# Patient Record
Sex: Female | Born: 1950 | Race: White | Hispanic: No | Smoking: Former smoker
Health system: Southern US, Community
[De-identification: ages and names within clinical notes are randomized; demographics above are authoritative.]

## PROBLEM LIST (undated history)

## (undated) DIAGNOSIS — Z972 Presence of dental prosthetic device (complete) (partial): Secondary | ICD-10-CM

## (undated) DIAGNOSIS — Z720 Tobacco use: Secondary | ICD-10-CM

## (undated) DIAGNOSIS — E7800 Pure hypercholesterolemia, unspecified: Secondary | ICD-10-CM

## (undated) DIAGNOSIS — I639 Cerebral infarction, unspecified: Secondary | ICD-10-CM

## (undated) HISTORY — PX: CHOLECYSTECTOMY: SHX55

---

## 2004-05-15 ENCOUNTER — Other Ambulatory Visit: Payer: Self-pay

## 2005-05-01 ENCOUNTER — Ambulatory Visit: Payer: Self-pay | Admitting: Family Medicine

## 2006-05-24 ENCOUNTER — Emergency Department: Payer: Self-pay | Admitting: Emergency Medicine

## 2010-02-06 ENCOUNTER — Inpatient Hospital Stay: Payer: Self-pay | Admitting: Surgery

## 2011-07-27 ENCOUNTER — Emergency Department: Payer: Self-pay | Admitting: *Deleted

## 2011-10-26 ENCOUNTER — Emergency Department: Payer: Self-pay | Admitting: Emergency Medicine

## 2017-01-22 ENCOUNTER — Encounter: Payer: Self-pay | Admitting: *Deleted

## 2017-01-22 ENCOUNTER — Ambulatory Visit
Admission: EM | Admit: 2017-01-22 | Discharge: 2017-01-22 | Disposition: A | Discharge status: Home / Self Care | Payer: Medicare Other | Attending: Family Medicine | Admitting: Family Medicine

## 2017-01-22 DIAGNOSIS — B86 Scabies: Secondary | ICD-10-CM | POA: Diagnosis not present

## 2017-01-22 MED ORDER — PERMETHRIN 5 % EX CREA: TOPICAL_CREAM | CUTANEOUS | 0 refills | Status: DC

## 2017-01-22 NOTE — ED Provider Notes (Signed)
[facility]    CSN: [id number] Arrival date & time: 01/22/17  1709     History   Chief Complaint Chief Complaint  Patient presents with  . Rash    HPI Shelly Mitchell is a 66 y.o. female.   The history is provided by the patient.  Rash  Location:  Shoulder/arm Shoulder/arm rash location:  L arm, R arm, L hand and R hand Quality: itchiness, redness and scaling   Severity:  Moderate Onset quality:  Sudden Duration:  3 days Timing:  Constant Progression:  Worsening Chronicity:  New Context: not animal contact, not chemical exposure, not diapers, not eggs, not exposure to similar rash, not food, not hot tub use, not insect bite/sting, not medications, not new detergent/soap, not nuts, not plant contact, not pollen, not pregnancy, not sick contacts and not sun exposure   Context comment:  After helping friend move out and clean; states contact with many fabrics, clothing, etc  Relieved by:  Nothing Associated symptoms: no abdominal pain, no diarrhea, no fatigue, no fever, no headaches, no hoarse voice, no induration, no joint pain, no myalgias, no nausea, no periorbital edema, no shortness of breath, no sore throat, no throat swelling, no tongue swelling, no URI, not vomiting and not wheezing   Associated symptoms comment:  Severe itching; worse at night   History reviewed. No pertinent past medical history.  There are no active problems to display for this patient.   Past Surgical History:  Procedure Laterality Date  . CHOLECYSTECTOMY      OB History    No data available       Home Medications    Prior to Admission medications   Medication Sig Start Date End Date Taking? Authorizing Provider  permethrin (ELIMITE) 5 % cream Apply to affected area once 01/22/17   Payton Mccallum, MD    Family History History reviewed. No pertinent family history.  Social History Social History  Substance Use Topics  . Smoking status: Current Every Day Smoker    . Smokeless tobacco: Never Used  . Alcohol use No     Allergies   Patient has no known allergies.   Review of Systems Review of Systems  Constitutional: Negative for fatigue and fever.  HENT: Negative for hoarse voice and sore throat.   Respiratory: Negative for shortness of breath and wheezing.   Gastrointestinal: Negative for abdominal pain, diarrhea, nausea and vomiting.  Musculoskeletal: Negative for arthralgias and myalgias.  Skin: Positive for rash.  Neurological: Negative for headaches.     Physical Exam Triage Vital Signs ED Triage Vitals  Enc Vitals Group     BP 01/22/17 1837 (!) 195/91     Pulse Rate 01/22/17 1837 69     Resp 01/22/17 1837 16     Temp 01/22/17 1837 99.1 F (37.3 C)     Temp Source 01/22/17 1837 Oral     SpO2 01/22/17 1837 99 %     Weight 01/22/17 1841 176 lb (79.8 kg)     Height 01/22/17 1841 5\' 2"  (1.575 m)     Head Circumference --      Peak Flow --      Pain Score 01/22/17 1858 0     Pain Loc --      Pain Edu? --      Excl. in GC? --    No data found.   Updated Vital Signs BP (!) 195/91 (BP Location: Left Arm)   Pulse 69   Temp 99.1  F (37.3 C) (Oral)   Resp 16   Ht 5\' 2"  (1.575 m)   Wt 176 lb (79.8 kg)   SpO2 99%   BMI 32.19 kg/m   Visual Acuity Right Eye Distance:   Left Eye Distance:   Bilateral Distance:    Right Eye Near:   Left Eye Near:    Bilateral Near:     Physical Exam  Constitutional: She appears well-developed and well-nourished. No distress.  Skin: Rash noted. She is not diaphoretic. There is erythema (with burrows and excoriations on hands and arms).  Nursing note and vitals reviewed.    UC Treatments / Results  Labs (all labs ordered are listed, but only abnormal results are displayed) Labs Reviewed - No data to display  EKG  EKG Interpretation None       Radiology No results found.  Procedures Procedures (including critical care time)  Medications Ordered in UC Medications - No  data to display   Initial Impression / Assessment and Plan / UC Course  I have reviewed the triage vital signs and the nursing notes.  Pertinent labs & imaging results that were available during my care of the patient were reviewed by me and considered in my medical decision making (see chart for details).       Final Clinical Impressions(s) / UC Diagnoses   Final diagnoses:  Scabies    New Prescriptions Discharge Medication List as of 01/22/2017  6:57 PM    START taking these medications   Details  permethrin (ELIMITE) 5 % cream Apply to affected area once, Normal       1. diagnosis reviewed with patient 2. rx as per orders above; reviewed possible side effects, interactions, risks and benefits  3. Recommend supportive treatment with otc antihistamines  4. Follow-up prn if symptoms worsen or don't improve   Payton Mccallumonty, Adonai Selsor, MD 01/22/17 1940

## 2017-01-22 NOTE — ED Triage Notes (Signed)
Rash to arms since Wednesday.

## 2017-08-26 DIAGNOSIS — I639 Cerebral infarction, unspecified: Secondary | ICD-10-CM

## 2017-08-26 HISTORY — DX: Cerebral infarction, unspecified: I63.9

## 2018-07-27 ENCOUNTER — Inpatient Hospital Stay
Admission: EM | Admit: 2018-07-27 | Discharge: 2018-07-28 | DRG: 065 | Disposition: A | Discharge status: Home / Self Care | Payer: Medicare HMO | Attending: Internal Medicine | Admitting: Internal Medicine

## 2018-07-27 ENCOUNTER — Other Ambulatory Visit: Payer: Self-pay

## 2018-07-27 ENCOUNTER — Observation Stay: Payer: Medicare HMO

## 2018-07-27 ENCOUNTER — Encounter: Payer: Self-pay | Admitting: Emergency Medicine

## 2018-07-27 ENCOUNTER — Emergency Department: Payer: Medicare HMO

## 2018-07-27 ENCOUNTER — Ambulatory Visit (INDEPENDENT_AMBULATORY_CARE_PROVIDER_SITE_OTHER)
Admission: EM | Admit: 2018-07-27 | Discharge: 2018-07-27 | Disposition: A | Discharge status: Other Acute Inpatient Hospital | Payer: Medicare HMO | Source: Home / Self Care | Attending: Family Medicine | Admitting: Family Medicine

## 2018-07-27 DIAGNOSIS — F17200 Nicotine dependence, unspecified, uncomplicated: Secondary | ICD-10-CM

## 2018-07-27 DIAGNOSIS — R531 Weakness: Secondary | ICD-10-CM

## 2018-07-27 DIAGNOSIS — R202 Paresthesia of skin: Secondary | ICD-10-CM | POA: Diagnosis not present

## 2018-07-27 DIAGNOSIS — E785 Hyperlipidemia, unspecified: Secondary | ICD-10-CM | POA: Diagnosis present

## 2018-07-27 DIAGNOSIS — Z720 Tobacco use: Secondary | ICD-10-CM | POA: Diagnosis not present

## 2018-07-27 DIAGNOSIS — R200 Anesthesia of skin: Secondary | ICD-10-CM | POA: Insufficient documentation

## 2018-07-27 DIAGNOSIS — G459 Transient cerebral ischemic attack, unspecified: Secondary | ICD-10-CM

## 2018-07-27 DIAGNOSIS — Z716 Tobacco abuse counseling: Secondary | ICD-10-CM

## 2018-07-27 DIAGNOSIS — G8191 Hemiplegia, unspecified affecting right dominant side: Secondary | ICD-10-CM | POA: Diagnosis not present

## 2018-07-27 DIAGNOSIS — R030 Elevated blood-pressure reading, without diagnosis of hypertension: Secondary | ICD-10-CM

## 2018-07-27 DIAGNOSIS — I6381 Other cerebral infarction due to occlusion or stenosis of small artery: Principal | ICD-10-CM | POA: Diagnosis present

## 2018-07-27 DIAGNOSIS — I63533 Cerebral infarction due to unspecified occlusion or stenosis of bilateral posterior cerebral arteries: Secondary | ICD-10-CM | POA: Diagnosis not present

## 2018-07-27 DIAGNOSIS — R42 Dizziness and giddiness: Secondary | ICD-10-CM | POA: Diagnosis not present

## 2018-07-27 DIAGNOSIS — Z823 Family history of stroke: Secondary | ICD-10-CM

## 2018-07-27 DIAGNOSIS — I639 Cerebral infarction, unspecified: Secondary | ICD-10-CM | POA: Diagnosis not present

## 2018-07-27 DIAGNOSIS — Z9049 Acquired absence of other specified parts of digestive tract: Secondary | ICD-10-CM | POA: Diagnosis not present

## 2018-07-27 DIAGNOSIS — I6523 Occlusion and stenosis of bilateral carotid arteries: Secondary | ICD-10-CM | POA: Diagnosis not present

## 2018-07-27 DIAGNOSIS — R29703 NIHSS score 3: Secondary | ICD-10-CM | POA: Diagnosis present

## 2018-07-27 DIAGNOSIS — R51 Headache: Secondary | ICD-10-CM | POA: Diagnosis not present

## 2018-07-27 DIAGNOSIS — G4489 Other headache syndrome: Secondary | ICD-10-CM | POA: Diagnosis not present

## 2018-07-27 DIAGNOSIS — I10 Essential (primary) hypertension: Secondary | ICD-10-CM | POA: Diagnosis not present

## 2018-07-27 DIAGNOSIS — F17210 Nicotine dependence, cigarettes, uncomplicated: Secondary | ICD-10-CM | POA: Diagnosis present

## 2018-07-27 HISTORY — DX: Tobacco use: Z72.0

## 2018-07-27 LAB — CBC
HCT: 40.3 % (ref 36.0–46.0)
HEMOGLOBIN: 12.8 g/dL (ref 12.0–15.0)
MCH: 30.5 pg (ref 26.0–34.0)
MCHC: 31.8 g/dL (ref 30.0–36.0)
MCV: 96 fL (ref 80.0–100.0)
Platelets: 303 10*3/uL (ref 150–400)
RBC: 4.2 MIL/uL (ref 3.87–5.11)
RDW: 12.9 % (ref 11.5–15.5)
WBC: 10 10*3/uL (ref 4.0–10.5)
nRBC: 0 % (ref 0.0–0.2)

## 2018-07-27 LAB — GLUCOSE, CAPILLARY
Glucose-Capillary: 111 mg/dL — ABNORMAL HIGH (ref 70–99)
Glucose-Capillary: 111 mg/dL — ABNORMAL HIGH (ref 70–99)

## 2018-07-27 LAB — URINE DRUG SCREEN, QUALITATIVE (ARMC ONLY)
Amphetamines, Ur Screen: NOT DETECTED
Barbiturates, Ur Screen: NOT DETECTED
Benzodiazepine, Ur Scrn: NOT DETECTED
Cannabinoid 50 Ng, Ur ~~LOC~~: NOT DETECTED
Cocaine Metabolite,Ur ~~LOC~~: NOT DETECTED
MDMA (Ecstasy)Ur Screen: NOT DETECTED
Methadone Scn, Ur: NOT DETECTED
OPIATE, UR SCREEN: NOT DETECTED
Phencyclidine (PCP) Ur S: NOT DETECTED
Tricyclic, Ur Screen: NOT DETECTED

## 2018-07-27 LAB — PROTIME-INR
INR: 1.05
Prothrombin Time: 13.6 seconds (ref 11.4–15.2)

## 2018-07-27 LAB — COMPREHENSIVE METABOLIC PANEL
ALT: 16 U/L (ref 0–44)
AST: 20 U/L (ref 15–41)
Albumin: 3.7 g/dL (ref 3.5–5.0)
Alkaline Phosphatase: 93 U/L (ref 38–126)
Anion gap: 6 (ref 5–15)
BUN: 15 mg/dL (ref 8–23)
CO2: 29 mmol/L (ref 22–32)
Calcium: 8.8 mg/dL — ABNORMAL LOW (ref 8.9–10.3)
Chloride: 106 mmol/L (ref 98–111)
Creatinine, Ser: 0.85 mg/dL (ref 0.44–1.00)
GFR calc Af Amer: >60 mL/min (ref >60)
GFR calc non Af Amer: >60 mL/min (ref >60)
GLUCOSE: 105 mg/dL — AB (ref 70–99)
Potassium: 3.7 mmol/L (ref 3.5–5.1)
Sodium: 141 mmol/L (ref 135–145)
Total Bilirubin: 0.5 mg/dL (ref 0.3–1.2)
Total Protein: 7 g/dL (ref 6.5–8.1)

## 2018-07-27 LAB — APTT: aPTT: 27 seconds (ref 24–36)

## 2018-07-27 LAB — URINALYSIS, COMPLETE (UACMP) WITH MICROSCOPIC
BILIRUBIN URINE: NEGATIVE
Glucose, UA: NEGATIVE mg/dL
KETONES UR: NEGATIVE mg/dL
Nitrite: NEGATIVE
PROTEIN: NEGATIVE mg/dL
Specific Gravity, Urine: 1.006 (ref 1.005–1.030)
pH: 6 (ref 5.0–8.0)

## 2018-07-27 LAB — DIFFERENTIAL
ABS IMMATURE GRANULOCYTES: 0.04 10*3/uL (ref 0.00–0.07)
Basophils Absolute: 0.1 10*3/uL (ref 0.0–0.1)
Basophils Relative: 1 %
Eosinophils Absolute: 0.2 10*3/uL (ref 0.0–0.5)
Eosinophils Relative: 2 %
Immature Granulocytes: 0 %
Lymphocytes Relative: 24 %
Lymphs Abs: 2.4 10*3/uL (ref 0.7–4.0)
Monocytes Absolute: 0.7 10*3/uL (ref 0.1–1.0)
Monocytes Relative: 7 %
Neutro Abs: 6.6 10*3/uL (ref 1.7–7.7)
Neutrophils Relative %: 66 %

## 2018-07-27 LAB — TROPONIN I: Troponin I: <0.03 ng/mL (ref <0.03)

## 2018-07-27 LAB — ETHANOL: Alcohol, Ethyl (B): <10 mg/dL (ref <10)

## 2018-07-27 MED ORDER — ASPIRIN 81 MG PO CHEW
324.0000 mg | CHEWABLE_TABLET | Freq: Once | ORAL | Status: AC
  Administered 2018-07-27: 324 mg via ORAL
  Filled 2018-07-27: qty 4

## 2018-07-27 MED ORDER — ACETAMINOPHEN 650 MG RE SUPP: 650.0000 mg | RECTAL | Status: DC | PRN

## 2018-07-27 MED ORDER — ATORVASTATIN CALCIUM 20 MG PO TABS
40.0000 mg | ORAL_TABLET | Freq: Every day | ORAL | Status: DC
  Administered 2018-07-27 – 2018-07-28 (×2): 40 mg via ORAL
  Filled 2018-07-27 (×2): qty 2

## 2018-07-27 MED ORDER — NICOTINE 21 MG/24HR TD PT24
21.0000 mg | MEDICATED_PATCH | Freq: Every day | TRANSDERMAL | Status: DC
  Filled 2018-07-27: qty 1

## 2018-07-27 MED ORDER — ASPIRIN 325 MG PO TABS
325.0000 mg | ORAL_TABLET | Freq: Every day | ORAL | Status: DC
  Administered 2018-07-28: 09:00:00 325 mg via ORAL
  Filled 2018-07-27 (×2): qty 1

## 2018-07-27 MED ORDER — STROKE: EARLY STAGES OF RECOVERY BOOK
Freq: Once | Status: AC
  Administered 2018-07-27: 18:00:00

## 2018-07-27 MED ORDER — ACETAMINOPHEN 160 MG/5ML PO SOLN
650.0000 mg | ORAL | Status: DC | PRN
  Filled 2018-07-27: qty 20.3

## 2018-07-27 MED ORDER — ACETAMINOPHEN 325 MG PO TABS
650.0000 mg | ORAL_TABLET | ORAL | Status: DC | PRN
  Administered 2018-07-27 (×2): 650 mg via ORAL
  Filled 2018-07-27 (×2): qty 2

## 2018-07-27 MED ORDER — ENOXAPARIN SODIUM 40 MG/0.4ML ~~LOC~~ SOLN
40.0000 mg | SUBCUTANEOUS | Status: DC
  Administered 2018-07-27 – 2018-07-28 (×2): 40 mg via SUBCUTANEOUS
  Filled 2018-07-27 (×2): qty 0.4

## 2018-07-27 NOTE — Progress Notes (Signed)
   07/27/18 1341  Clinical Encounter Type  Visited With Patient not available;Other (Comment) (unit chaplain)  Visit Type Code   Chaplain responded to code stroke, waited outside patient's room while nurse engaged patient and visitor.  Unit chaplain arrived and on-call chaplain transitioned away to another patient.

## 2018-07-27 NOTE — H&P (Signed)
[facility] [place] [facility]   PATIENT NAME: Shelly Mitchell    MR#:  [id number]  DATE OF BIRTH:  17-Jul-1951  DATE OF ADMISSION:  07/27/2018  PRIMARY CARE PHYSICIAN: Patient, No Pcp Per   REQUESTING/REFERRING PHYSICIAN: Sharman Cheek, MD  CHIEF COMPLAINT:   Chief Complaint  Patient presents with  . Weakness    HISTORY OF PRESENT ILLNESS: Shelly Mitchell  is a 67 y.o. female with no medical problems except nicotine abuse who is presenting to the hospital with complaint of headache and right arm weakness and right leg weakness.  Patient states that the symptoms started this morning.  They have since improved and mostly resolved.  Swallowing difficulties no numbness or tingling.      PAST MEDICAL HISTORY:  History reviewed. No pertinent past medical history.  PAST SURGICAL HISTORY:  Past Surgical History:  Procedure Laterality Date  . CHOLECYSTECTOMY      SOCIAL HISTORY:  Social History   Tobacco Use  . Smoking status: Current Every Day Smoker  . Smokeless tobacco: Never Used  Substance Use Topics  . Alcohol use: No    FAMILY HISTORY:  Family History  Problem Relation Age of Onset  . CVA Mother     DRUG ALLERGIES: No Known Allergies  REVIEW OF SYSTEMS:   CONSTITUTIONAL: No fever, fatigue or weakness.  EYES: No blurred or double vision.  EARS, NOSE, AND THROAT: No tinnitus or ear pain.  RESPIRATORY: No cough, shortness of breath, wheezing or hemoptysis.  CARDIOVASCULAR: No chest pain, orthopnea, edema.  GASTROINTESTINAL: No nausea, vomiting, diarrhea or abdominal pain.  GENITOURINARY: No dysuria, hematuria.  ENDOCRINE: No polyuria, nocturia,  HEMATOLOGY: No anemia, easy bruising or bleeding SKIN: No rash or lesion. MUSCULOSKELETAL: No joint pain or arthritis.   NEUROLOGIC: Right arm weakness right lower extremity weakness PSYCHIATRY: No anxiety or depression.   MEDICATIONS AT HOME:  Prior to Admission medications    Medication Sig Start Date End Date Taking? Authorizing Provider  permethrin (ELIMITE) 5 % cream Apply to affected area once 01/22/17   Payton Mccallum, MD      PHYSICAL EXAMINATION:   VITAL SIGNS: Blood pressure (!) 190/62, pulse 67, temperature 98.1 F (36.7 C), temperature source Oral, resp. rate 20, weight 79.8 kg, SpO2 97 %.  GENERAL:  67 y.o.-year-old patient lying in the bed with no acute distress.  EYES: Pupils equal, round, reactive to light and accommodation. No scleral icterus. Extraocular muscles intact.  HEENT: Head atraumatic, normocephalic. Oropharynx and nasopharynx clear.  NECK:  Supple, no jugular venous distention. No thyroid enlargement, no tenderness.  LUNGS: Normal breath sounds bilaterally, no wheezing, rales,rhonchi or crepitation. No use of accessory muscles of respiration.  CARDIOVASCULAR: S1, S2 normal. No murmurs, rubs, or gallops.  ABDOMEN: Soft, nontender, nondistended. Bowel sounds present. No organomegaly or mass.  EXTREMITIES: No pedal edema, cyanosis, or clubbing.  NEUROLOGIC: Cranial nerves II through XII are intact. Muscle strength 4/5 i right upper extremity right lower extremity sensation intact. Gait not checked.  PSYCHIATRIC: The patient is alert and oriented x 3.  SKIN: No obvious rash, lesion, or ulcer.   LABORATORY PANEL:   CBC Recent Labs  Lab 07/27/18 1337  WBC 10.0  HGB 12.8  HCT 40.3  PLT 303  MCV 96.0  MCH 30.5  MCHC 31.8  RDW 12.9  LYMPHSABS 2.4  MONOABS 0.7  EOSABS 0.2  BASOSABS 0.1   ------------------------------------------------------------------------------------------------------------------  Chemistries  Recent Labs  Lab 07/27/18 1337  NA 141  K  3.7  CL 106  CO2 29  GLUCOSE 105*  BUN 15  CREATININE 0.85  CALCIUM 8.8*  AST 20  ALT 16  ALKPHOS 93  BILITOT 0.5   ------------------------------------------------------------------------------------------------------------------ estimated creatinine  clearance is 62.9 mL/min (by C-G formula based on SCr of 0.85 mg/dL). ------------------------------------------------------------------------------------------------------------------ No results for input(s): TSH, T4TOTAL, T3FREE, THYROIDAB in the last 72 hours.  Invalid input(s): FREET3   Coagulation profile Recent Labs  Lab 07/27/18 1337  INR 1.05   ------------------------------------------------------------------------------------------------------------------- No results for input(s): DDIMER in the last 72 hours. -------------------------------------------------------------------------------------------------------------------  Cardiac Enzymes Recent Labs  Lab 07/27/18 1337  TROPONINI <0.03   ------------------------------------------------------------------------------------------------------------------ Invalid input(s): POCBNP  ---------------------------------------------------------------------------------------------------------------  Urinalysis    Component Value Date/Time   COLORURINE YELLOW (A) 07/27/2018 1455   APPEARANCEUR CLOUDY (A) 07/27/2018 1455   LABSPEC 1.006 07/27/2018 1455   PHURINE 6.0 07/27/2018 1455   GLUCOSEU NEGATIVE 07/27/2018 1455   HGBUR SMALL (A) 07/27/2018 1455   BILIRUBINUR NEGATIVE 07/27/2018 1455   KETONESUR NEGATIVE 07/27/2018 1455   PROTEINUR NEGATIVE 07/27/2018 1455   NITRITE NEGATIVE 07/27/2018 1455   LEUKOCYTESUR TRACE (A) 07/27/2018 1455     RADIOLOGY: US Carotid Bilateral (at [facility] Only)  Result Date: 07/27/2018 CLINICAL DATA:  Stroke/TIA.  History hypertension and smoking. EXAM: BILATERAL CAROTID DUPLEX ULTRASOUND TECHNIQUE: Wallace Cullens scale imaging, color Doppler and duplex ultrasound were performed of bilateral carotid and vertebral arteries in the neck. COMPARISON:  None. FINDINGS: Criteria: Quantification of carotid stenosis is based on velocity parameters that correlate the residual internal carotid diameter with  NASCET-based stenosis levels, using the diameter of the distal internal carotid lumen as the denominator for stenosis measurement. The following velocity measurements were obtained: RIGHT ICA:  108/35 cm/sec CCA:  90/15 cm/sec SYSTOLIC ICA/CCA RATIO:  1.1 ECA:  98 cm/sec LEFT ICA:  82/24 cm/sec CCA:  80/20 cm/sec SYSTOLIC ICA/CCA RATIO:  1.0 ECA:  87 cm/sec RIGHT CAROTID ARTERY: There is a minimal amount of hypoechoic plaque within the right carotid bulb (image 14 and 16), not resulting in elevated peak systolic velocities within the interrogated course of the right internal carotid artery to suggest a hemodynamically significant stenosis. RIGHT VERTEBRAL ARTERY:  Antegrade Flow LEFT CAROTID ARTERY: There is a minimal amount of mixed echogenic plaque within the left carotid bulb (image 49), not resulting in elevated peak systolic velocities within the interrogated course of the left internal carotid artery to suggest a hemodynamically significant stenosis. LEFT VERTEBRAL ARTERY:  Antegrade Flow IMPRESSION: Minimal amount of bilateral atherosclerotic plaque left subjectively greater than right, not resulting in a hemodynamically significant stenosis within either internal carotid artery. Electronically Signed   By: Simonne Come M.D.   On: 07/27/2018 16:36   Ct Head Code Stroke Wo Contrast  Result Date: 07/27/2018 CLINICAL DATA:  Code stroke. Frontal headache with slight right upper and lower extremity weakness and paresthesia. EXAM: CT HEAD WITHOUT CONTRAST TECHNIQUE: Contiguous axial images were obtained from the base of the skull through the vertex without intravenous contrast. COMPARISON:  None. FINDINGS: Brain: There is no evidence of acute infarct, intracranial hemorrhage, mass, midline shift, or extra-axial fluid collection. Focal encephalomalacia is noted in the body of the corpus callosum on the left. Periventricular white matter hypodensities elsewhere are nonspecific but compatible with mild chronic  small vessel ischemic disease. Cerebral volume is normal. Vascular: Calcified atherosclerosis at the skull base. No hyperdense vessel. Skull: No fracture or suspicious osseous lesion. Sinuses/Orbits: Visualized paranasal sinuses and mastoid air cells are clear. Visualized orbits are  unremarkable. Other: None. ASPECTS (Alberta Stroke Program Early CT Score) - Ganglionic level infarction ([facility], lentiform nuclei, internal capsule, insula, M1-M3 cortex): 7 - Supraganglionic infarction (M4-M6 cortex): 3 Total score (0-10 with 10 being normal): 10 IMPRESSION: 1. No evidence of acute intracranial abnormality. 2. ASPECTS is 10. 3. Mild chronic small vessel ischemic disease. 4. 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 These results were called by telephone at the time of interpretation on 07/27/2018 at 1:50 pm to Dr. Sharman CheekPHILLIP STAFFORD , who verbally acknowledged these results. Electronically Signed   By: Sebastian AcheAllen  Grady M.D.   On: 07/27/2018 13:51    EKG: Orders placed or performed during the hospital encounter of 07/27/18  . ED EKG  . ED EKG    IMPRESSION AND PLAN: Patient 67 year old presenting with right upper extremity right lower extremity weakness  1.  Acute CVA We will obtain a swallow eval PT evaluation Obtain MRI MRI of the brain Carotid Doppler Echocardiogram of the heart Aspirin Start patient on Lipitor Lipid panel in the morning Neurology evaluation  2.  Nicotine abuse smoking cessation provided strongly recommend patient stop smoking 4 minutes spent nicotine patch will be started    All the records are reviewed and case discussed with ED provider. Management plans discussed with the patient, family and they are in agreement.  CODE STATUS:    TOTAL TIME TAKING CARE OF THIS PATIENT: 55 minutes.    Auburn BilberryShreyang Maleka Contino M.D on 07/27/2018 at 4:52 PM  Between 7am to 6pm - Pager - [phone]  After 6pm go to [email or web address] - password [email or web address] [facility]  [facility]  [phone]  CC: Primary care physician; Patient, No Pcp Per

## 2018-07-27 NOTE — Consult Note (Signed)
CODE STROKE- PHARMACY COMMUNICATION   Time CODE STROKE called/page received:[phone]  Time response to CODE STROKE was made (in person or via phone):   Time Stroke Kit retrieved from [facility] (only if needed):Patient declined tPA  Name of Provider/Nurse contacted: Joelene Millin  No past medical history on file. Prior to Admission medications   Medication Sig Start Date End Date Taking? Authorizing Provider  permethrin Nancy Fetter) 5 % cream Apply to affected area once 01/22/17   Norval Gable, MD     Paticia Stack, PharmD Pharmacy Resident  07/27/2018 2:20 PM

## 2018-07-27 NOTE — ED Notes (Signed)
Patient concludes neuro consult and declines TPA. Speech clear, patient coherent, daughter at bedside, time of onset now prior to 11am due to daughter noticing got call from mom at 11am and mom states symptoms started prior to that.

## 2018-07-27 NOTE — ED Provider Notes (Signed)
[facility]    CSN: [id number] Arrival date & time: 07/27/18  1157     History   Chief Complaint Chief Complaint  Patient presents with  . Dizziness  . Numbness    HPI Shelly Mitchell is a 67 y.o. female.   67 yo female, smoker, presents with a c/o sudden onset of right arm and right leg numbness and weakness about 1-1.5 hours ago, associated with sudden onset of headache and dizziness.  States she had been putting some Christmas lights outside and had just walked into her house to do dishes when symptoms began. Denies any vision changes, slurred speech, chest pains or shortness of breath.   No prior medical history known as patient states she does not see any doctors.   The history is provided by the patient.  Dizziness    History reviewed. No pertinent past medical history.  There are no active problems to display for this patient.   Past Surgical History:  Procedure Laterality Date  . CHOLECYSTECTOMY      OB History   None      Home Medications    Prior to Admission medications   Medication Sig Start Date End Date Taking? Authorizing Provider  permethrin (ELIMITE) 5 % cream Apply to affected area once 01/22/17   Payton Mccallumonty, Aiman Noe, MD    Family History History reviewed. No pertinent family history.  Social History Social History   Tobacco Use  . Smoking status: Current Every Day Smoker  . Smokeless tobacco: Never Used  Substance Use Topics  . Alcohol use: No  . Drug use: No     Allergies   Patient has no known allergies.   Review of Systems Review of Systems  Neurological: Positive for dizziness.     Physical Exam Triage Vital Signs ED Triage Vitals  Enc Vitals Group     BP 07/27/18 1207 (!) 165/70     Pulse Rate 07/27/18 1207 90     Resp 07/27/18 1207 18     Temp 07/27/18 1207 97.8 F (36.6 C)     Temp Source 07/27/18 1207 Oral     SpO2 07/27/18 1207 100 %     Weight 07/27/18 1210 176 lb (79.8 kg)     Height  07/27/18 1210 5\' 2"  (1.575 m)     Head Circumference --      Peak Flow --      Pain Score 07/27/18 1209 0     Pain Loc --      Pain Edu? --      Excl. in GC? --    No data found.  Updated Vital Signs BP (!) 165/70 (BP Location: Right Arm)   Pulse 90   Temp 97.8 F (36.6 C) (Oral)   Resp 18   Ht 5\' 2"  (1.575 m)   Wt 79.8 kg   SpO2 100%   BMI 32.19 kg/m   Visual Acuity Right Eye Distance:   Left Eye Distance:   Bilateral Distance:    Right Eye Near:   Left Eye Near:    Bilateral Near:     Physical Exam  Constitutional: She is oriented to person, place, and time. She appears well-developed and well-nourished. No distress.  Cardiovascular: Normal rate, regular rhythm, normal heart sounds and intact distal pulses.  Pulmonary/Chest: Effort normal and breath sounds normal. No stridor. No respiratory distress. She has no wheezes. She has no rales.  Neurological: She is alert and oriented to person, place, and time. She  displays normal reflexes. No cranial nerve deficit or sensory deficit. She exhibits abnormal muscle tone (right upper and lower extremity weakness noted). Coordination normal.  Skin: She is not diaphoretic.  Nursing note and vitals reviewed.    UC Treatments / Results  Labs (all labs ordered are listed, but only abnormal results are displayed) Labs Reviewed  GLUCOSE, CAPILLARY - Abnormal; Notable for the following components:      Result Value   Glucose-Capillary 111 (*)    All other components within normal limits  CBG MONITORING, ED    EKG None  Radiology No results found.  Procedures ED EKG Date/Time: 07/27/2018 12:39 PM Performed by: Payton Mccallum, MD Authorized by: Payton Mccallum, MD   ECG reviewed by ED Physician in the absence of a cardiologist: yes   Previous ECG:    Previous ECG:  Unavailable Interpretation:    Interpretation: normal   Rate:    ECG rate assessment: normal   Rhythm:    Rhythm: sinus rhythm   Ectopy:    Ectopy:  none   QRS:    QRS axis:  Normal Conduction:    Conduction: normal   ST segments:    ST segments:  Normal T waves:    T waves: normal   Other findings:    Other findings: prolonged qTc interval     (including critical care time)  Medications Ordered in UC Medications - No data to display  Initial Impression / Assessment and Plan / UC Course  I have reviewed the triage vital signs and the nursing notes.  Pertinent labs & imaging results that were available during my care of the patient were reviewed by me and considered in my medical decision making (see chart for details).      Final Clinical Impressions(s) / UC Diagnoses   Final diagnoses:  Right sided numbness  Right sided weakness  Elevated blood pressure reading   Discharge Instructions   None    ED Prescriptions    None     1. diagnosis reviewed with patient and daughter; recommend patient go to ED by EMS for further evaluation and management. IV placed.    Controlled Substance Prescriptions [place] Controlled Substance Registry consulted? Not Applicable   Payton Mccallum, MD 07/27/18 1256

## 2018-07-27 NOTE — ED Provider Notes (Signed)
[facility] Emergency Department Provider Note  ____________________________________________  Time seen: Approximately 2:37 PM  I have reviewed the triage vital signs and the nursing notes.   HISTORY  Chief Complaint Weakness    HPI Shelly Mitchell is a 67 y.o. female with a history of hypertension and smoking who complains of sudden onset of frontal headache at about 11:30 AM today, associated with weakness and paresthesia of the right arm and right leg.  Denies any vision changes, vomiting, syncope.  No chest pain or shortness of breath.  Symptoms have been constant, no aggravating or alleviating factors, appear to be improving since onset.      Past medical history noncontributory   There are no active problems to display for this patient.    Past Surgical History:  Procedure Laterality Date  . CHOLECYSTECTOMY       Prior to Admission medications   Medication Sig Start Date End Date Taking? Authorizing Provider  permethrin (ELIMITE) 5 % cream Apply to affected area once 01/22/17   Payton Mccallum, MD     Allergies Patient has no known allergies.   No family history on file.  Social History Social History   Tobacco Use  . Smoking status: Current Every Day Smoker  . Smokeless tobacco: Never Used  Substance Use Topics  . Alcohol use: No  . Drug use: No    Review of Systems  Constitutional:   No fever or chills.  ENT:   No sore throat. No rhinorrhea. Cardiovascular:   No chest pain or syncope. Respiratory:   No dyspnea or cough. Gastrointestinal:   Negative for abdominal pain, vomiting and diarrhea.  Musculoskeletal:   Negative for focal pain or swelling All other systems reviewed and are negative except as documented above in ROS and HPI.  ____________________________________________   PHYSICAL EXAM:  VITAL SIGNS: ED Triage Vitals  Enc Vitals Group     BP 07/27/18 1331 (!) 213/79     Pulse Rate 07/27/18 1331 90      Resp 07/27/18 1351 18     Temp 07/27/18 1331 97.8 F (36.6 C)     Temp Source 07/27/18 1331 Oral     SpO2 07/27/18 1331 99 %     Weight 07/27/18 1332 175 lb 14.8 oz (79.8 kg)     Height --      Head Circumference --      Peak Flow --      Pain Score 07/27/18 1331 0     Pain Loc --      Pain Edu? --      Excl. in GC? --     Vital signs reviewed, nursing assessments reviewed.   Constitutional:   Alert and oriented. Non-toxic appearance. Eyes:   Conjunctivae are normal. EOMI. PERRL. ENT      Head:   Normocephalic and atraumatic.      Nose:   No congestion/rhinnorhea.       Mouth/Throat:   MMM, no pharyngeal erythema. No peritonsillar mass.       Neck:   No meningismus. Full ROM. Hematological/Lymphatic/Immunilogical:   No cervical lymphadenopathy. Cardiovascular:   RRR. Symmetric bilateral radial and DP pulses.  No murmurs. Cap refill less than 2 seconds. Respiratory:   Normal respiratory effort without tachypnea/retractions. Breath sounds are clear and equal bilaterally. No wheezes/rales/rhonchi. Gastrointestinal:   Soft and nontender. Non distended. There is no CVA tenderness.  No rebound, rigidity, or guarding.  Musculoskeletal:   Normal range of motion in all  extremities. No joint effusions.  No lower extremity tenderness.  No edema. Neurologic:   Normal speech and language.  Slight relative motor weakness of the right arm and right leg compared to left Slight pronator drift on the right Relative sensory deficit of the right arm and right leg compared to left. NIH stroke scale 4 Skin:    Skin is warm, dry and intact. No rash noted.  No petechiae, purpura, or bullae.  ____________________________________________    LABS (pertinent positives/negatives) (all labs ordered are listed, but only abnormal results are displayed) Labs Reviewed  GLUCOSE, CAPILLARY - Abnormal; Notable for the following components:      Result Value   Glucose-Capillary 111 (*)    All other  components within normal limits  COMPREHENSIVE METABOLIC PANEL - Abnormal; Notable for the following components:   Glucose, Bld 105 (*)    Calcium 8.8 (*)    All other components within normal limits  CBC  DIFFERENTIAL  TROPONIN I  PROTIME-INR  APTT  URINE DRUG SCREEN, QUALITATIVE ([facility] ONLY)  URINALYSIS, COMPLETE ([facility]) WITH MICROSCOPIC  ETHANOL   ____________________________________________   EKG  Interpreted by me  Date: 07/27/2018  Rate: 74  Rhythm: normal sinus rhythm  QRS Axis: normal  Intervals: normal  ST/T Wave abnormalities: normal  Conduction Disutrbances: none  Narrative Interpretation: unremarkable      ____________________________________________    RADIOLOGY  Ct Head Code Stroke Wo Contrast  Result Date: 07/27/2018 CLINICAL DATA:  Code stroke. Frontal headache with slight right upper and lower extremity weakness and paresthesia. EXAM: CT HEAD WITHOUT CONTRAST TECHNIQUE: Contiguous axial images were obtained from the base of the skull through the vertex without intravenous contrast. COMPARISON:  None. FINDINGS: Brain: There is no evidence of acute infarct, intracranial hemorrhage, mass, midline shift, or extra-axial fluid collection. Focal encephalomalacia is noted in the body of the corpus callosum on the left. Periventricular white matter hypodensities elsewhere are nonspecific but compatible with mild chronic small vessel ischemic disease. Cerebral volume is normal. Vascular: Calcified atherosclerosis at the skull base. No hyperdense vessel. Skull: No fracture or suspicious osseous lesion. Sinuses/Orbits: Visualized paranasal sinuses and mastoid air cells are clear. Visualized orbits are unremarkable. Other: None. ASPECTS [facility] Stroke Program Early CT Score) - Ganglionic level infarction (caudate, lentiform nuclei, internal capsule, insula, M1-M3 cortex): 7 - Supraganglionic infarction (M4-M6 cortex): 3 Total score (0-10 with 10 being normal): 10  IMPRESSION: 1. No evidence of acute intracranial abnormality. 2. ASPECTS is 10. 3. Mild chronic small vessel ischemic disease. 4. 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 These results were called by telephone at the time of interpretation on 07/27/2018 at 1:50 pm to Dr. Sharman Cheek , who verbally acknowledged these results. Electronically Signed   By: Sebastian Ache M.D.   On: 07/27/2018 13:51    ____________________________________________   PROCEDURES .Critical Care Performed by: Sharman Cheek, MD Authorized by: Sharman Cheek, MD   Critical care provider statement:    Critical care time (minutes):  35   Critical care time was exclusive of:  Separately billable procedures and treating other patients   Critical care was necessary to treat or prevent imminent or life-threatening deterioration of the following conditions:  CNS failure or compromise   Critical care was time spent personally by me on the following activities:  Development of treatment plan with patient or surrogate, discussions with consultants, evaluation of patient's response to treatment, examination of patient, obtaining history from patient or surrogate, ordering and performing treatments  and interventions, ordering and review of laboratory studies, ordering and review of radiographic studies, pulse oximetry, re-evaluation of patient's condition and review of old charts    ____________________________________________  DIFFERENTIAL DIAGNOSIS   Ischemic stroke, complicated headache, intracranial hemorrhage, hypertensive crisis  CLINICAL IMPRESSION / ASSESSMENT AND PLAN / ED COURSE  Pertinent labs & imaging results that were available during my care of the patient were reviewed by me and considered in my medical decision making (see chart for details).    Patient presents with acute onset of headache and neurologic symptoms.  Highest concern for ischemic stroke.  Doubt meningitis  encephalitis temporal arteritis or intracranial hypertension or glaucoma.  Case was discussed with radiology after their read of the stat CT head, confirming no intracranial hemorrhage or visible infarct.  Clinical Course as of Jul 27 1441  Mon Jul 27, 2018  1413 Case discussed with stroke neurologist to agrees NIH stroke scale of 3, recommends hospitalization for further stroke work-up.  Patient has declined TPA.  Proceed with permissive hypertension to maintain perfusion to potentially ischemic area.   [PS]    Clinical Course User Index [PS] Sharman CheekStafford, Aizza Santiago, MD     ____________________________________________   FINAL CLINICAL IMPRESSION(S) / ED DIAGNOSES    Final diagnoses:  Acute ischemic stroke [facility](HCC)  Right-sided weakness and paresthesia   ED Discharge Orders    None      Portions of this note were generated with dragon dictation software. Dictation errors may occur despite best attempts at proofreading.    Sharman CheekStafford, Hebert Dooling, MD 07/27/18 [phone]

## 2018-07-27 NOTE — ED Triage Notes (Signed)
Patient states she became dizzy and having right arm numbness that started 1 hour ago. Patient states she has never had this symptoms before.

## 2018-07-27 NOTE — Progress Notes (Signed)
Chaplain [facility] when seeing on call chaplain present. Chaplain released on call chaplain. Chaplain maintained a pastoral presence as Pt and daughter talked to teleconference. Chaplain aided in helping daughter understand the importance of time for care to the patient. Chaplain prayed for the Pt and family.    07/27/18 1400  Clinical Encounter Type  Visited With Patient and family together  Visit Type Initial;Spiritual support;Code  Referral From Care management  Spiritual Encounters  Spiritual Needs Prayer;Emotional

## 2018-07-27 NOTE — Evaluation (Signed)
Physical Therapy Evaluation Patient Details Name: Shelly Mitchell MRN: [id number] DOB: [birth year] Today's Date: 07/27/2018   History of Present Illness  67 yo female was referred to PT after having developing R side weakness and numbness with a frontal HA.  Has MRI planned, counseled to stop smoking.   Has been up to walk since admission but not interested in using AD.  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  Pt is against the idea of CIR and walker, but talked with her about her deficits of strength and safety.  Follow acutely for progression of mobility and ck to see if stroke is diagnosed for recent changes.      Follow Up Recommendations CIR    Equipment Recommendations  Rolling walker with 5" wheels    Recommendations for Other Services Rehab consult     Precautions / Restrictions Precautions Precautions: Fall Precaution Comments: needs RW Restrictions Weight Bearing Restrictions: No      Mobility  Bed Mobility Overal bed mobility: Modified Independent                Transfers Overall transfer level: Modified independent Equipment used: None                Ambulation/Gait Ambulation/Gait assistance: Min guard Gait Distance (Feet): 150 Feet Assistive device: Rolling walker (2 wheeled);Straight cane Gait Pattern/deviations: Step-through pattern;Decreased stride length;Shuffle Gait velocity: reduc Gait velocity interpretation: <1.31 ft/sec, indicative of household Insurance account manager [employer] (Stroke Patients Only)       Balance Overall balance assessment: Needs assistance Sitting-balance support: Feet supported Sitting balance-Leahy Scale: Good     Standing balance support: Bilateral upper extremity  supported Standing balance-Leahy Scale: Fair                               Pertinent Vitals/Pain Pain Assessment: No/denies pain    Home Living Family/patient expects to be discharged to:: Private residence Living Arrangements: Children Available Help at Discharge: Family Type of Home: House Home Access: Stairs to enter     Home Layout: One level Home Equipment: Environmental consultant - 2 wheels;Cane - single point      Prior Function Level of Independence: Independent               Hand Dominance   Dominant Hand: Right    Extremity/Trunk Assessment   Upper Extremity Assessment Upper Extremity Assessment: Overall WFL for tasks assessed    Lower Extremity Assessment Lower Extremity Assessment: RLE deficits/detail RLE Deficits / Details: 4- strength Hamstrings, ankle RLE Sensation: decreased light touch RLE Coordination: decreased fine motor;decreased gross motor    Cervical / Trunk Assessment Cervical / Trunk Assessment: Normal  Communication   Communication: No difficulties  Cognition Arousal/Alertness: Awake/alert Behavior During Therapy: WFL for tasks assessed/performed Overall Cognitive Status: Within Functional Limits for tasks assessed                                        General Comments      Exercises     Assessment/Plan    PT  Assessment Patient needs continued PT services  PT Problem List Decreased strength;Decreased range of motion;Decreased activity tolerance;Decreased balance;Decreased mobility;Decreased coordination;Decreased knowledge of use of DME;Decreased safety awareness;Cardiopulmonary status limiting activity;Obesity       PT Treatment Interventions DME instruction;Gait training;Stair training;Functional mobility training;Therapeutic activities;Therapeutic exercise;Balance training;Neuromuscular re-education;Patient/family education    PT Goals (Current goals can be found in the Care Plan section)  Acute Rehab PT  Goals Patient Stated Goal: to walk and get stronger PT Goal Formulation: With patient Time For Goal Achievement: 08/10/18 Potential to Achieve Goals: Good    Frequency Min 2X/week   Barriers to discharge Inaccessible home environment;Decreased caregiver support home alone at times, stairs    Co-evaluation               AM-PAC PT "6 Clicks" Mobility  Outcome Measure Help needed turning from your back to your side while in a flat bed without using bedrails?: A Little Help needed moving from lying on your back to sitting on the side of a flat bed without using bedrails?: A Little Help needed moving to and from a bed to a chair (including a wheelchair)?: A Little Help needed standing up from a chair using your arms (e.g., wheelchair or bedside chair)?: A Little Help needed to walk in hospital room?: A Little Help needed climbing 3-5 steps with a railing? : A Lot 6 Click Score: 17    End of Session Equipment Utilized During Treatment: Gait belt Activity Tolerance: Patient limited by fatigue Patient left: in bed;with call bell/phone within reach;with bed alarm set;with family/visitor present Nurse Communication: Mobility status PT Visit Diagnosis: Unsteadiness on feet (R26.81);Muscle weakness (generalized) (M62.81);Difficulty in walking, not elsewhere classified (R26.2);Hemiplegia and hemiparesis Hemiplegia - Right/Left: Right Hemiplegia - dominant/non-dominant: Dominant Hemiplegia - caused by: Unspecified    Time: [id number] PT Time Calculation (min) (ACUTE ONLY): 31 min   Charges:   PT Evaluation $PT Eval Moderate Complexity: 1 Mod PT Treatments $Gait Training: 8-22 mins        Ivar DrapeRuth E Maxxwell Edgett 07/27/2018, 8:23 PM   Samul Dadauth Daxten Kovalenko, PT MS Acute Rehab Dept. [facility]: [facility] [id number] and [facility] [phone]

## 2018-07-27 NOTE — ED Triage Notes (Signed)
Pt presents today for weakness/numbness tot he r/s. Pt states she went to urgent care for tx r/t to the numbness she was feeling from right arm to right leg. Pt denies it now. Pt states she still has HA and dizziness. EDP at bedside.

## 2018-07-27 NOTE — Consult Note (Signed)
[facility] TeleNeurology Consult Services   TeleStroke Metrics: LKW: 1100 Door Time: 1329  [facility] Contacted: 1348  [facility] at Bedside: 1355 NIHSS: 1403 Decision on Alteplase: Patient has deferred IV alteplase administration due to concerns of internal bleeding. Interventional Candidate: Not a candidate as her symptoms are not consistent with a large vessel proximal occlusion.   Chief Complaint: Dizziness with right-sided weakness and numbness   HPI: Asked to see this patient in emergent telemedicine consultation utilizing interactive audio and video technologies. ?Consultation was performed with assistance of ancillary / medical staff at bedside.   Verbal consent to perform the examination with telemedicine was obtained. Patient agreed to proceed with the consultation for acute stroke protocol.  67 year old right-handed white female who was referred from urgent care to the ER for evaluation of acute right-sided weakness and numbness.  Patient's family is at bedside.  Patient does not take any aspirin at baseline.  She denies any past medical history.  She is an active smoker.  Patient states that she went outside around 10 AM to start putting up the Christmas lights.  Sometime around 11 AM, as she was walking back into the house, she had acute ataxia and headaches.  She then developed right-sided weakness and numbness.  She went to a local urgent care center, and then was quickly referred to the emergency room.  Currently, her blood pressure is 220/80.  On examination, she has mild right arm and leg drifting and right facial numbness.  Head CT is negative.  I reviewed with the patient and her family about the availability of IV alteplase.  I reviewed with them about some of the potential side effects of IV alteplase to include an approximate 6% risk of symptomatic intracranial hemorrhage, internal bleeding, and/or angioedema.  I also reviewed with them about some of the  potential benefits of IV alteplase to include an approximate 30% chance of improvement at 3 months with the medication versus an approximate 20% chance of improvement at 3 months without the medication.  At the end of the day, the patient has deferred IV alteplase administration due to concerns of internal bleeding.  She has opted for a more conservative route.   PMH: Denies.   SOC: Positive for tobacco abuse.  Negative x2.  Patient is married.   FMH: Positive for stroke.    ROS: 13 point review systems were reviewed with the patient, and are all negative with the exception of the aforementioned in the history of present illness.   VS: Temperature 97.8 F, pulse 72, respiration 18, blood pressure 220/80, oxygen saturation 97%, weight 79.8 kg   Exam: Patient is in no apparent distress.  Patient appears as stated age.  No obvious acute respiratory or cardiac distress.  Patient is well groomed and well-nourished. 1a- LOC: Keenly responsive - 0     1b- LOC questions: Answers both questions correctly - 0    1c- LOC commands- Performs both tasks correctly- 0    2- Gaze: Normal; no gaze paresis or gaze deviation - 0    3- Visual Fields: normal, no Visual field deficit - 0    4- Facial movements: no facial palsy - 0    5- Upper limb motor - right arm drift - 1    6- Lower limb motor - right leg drift - 1     7- Limb Coordination: absent ataxia - 0     8- Sensory: right face sensory loss - 1     9- Language -  No aphasia - 0     10- Speech - No dysarthria -0    11- Neglect / Extinction - none found - 0   NIHSS score:  3   Diagnostic Data: CT of the head showed no acute intracranial process   Medical Data Reviewed:   1.Data?reviewed include clinical labs, radiology,?and medical tests;   2.Tests?results discussed w/performing or interpreting physician;   3.Obtaining/reviewing old medical records;   4.Obtaining?case history from another source;   5.Independent?review of image, tracing, or  specimen.    Medical Decision Making:   - Extensive number of diagnosis or management options are considered below.   - Extensive amount of complex data reviewed.   - High risk of complication and/or morbidity or mortality are associated with differential diagnostic considerations below.   - There may be?uncertain?outcome and increased probability of prolonged functional impairment or high probability of severe prolonged functional impairment associated with some of these differential diagnosis.    Differential Diagnosis for Stroke:   1.?Cardioembolic?stroke   2. Small vessel disease/lacune   3. Thromboembolic, artery-to-artery mechanism   4.?Hypercoagulable?state-related infarct   5. Transient ischemic attack   6. Thrombotic mechanism, large artery disease    Assessment: 1.  Probable left subcortical stroke 2.  Probable hypertension 3.  Tobacco abuse  Recommendations: Patient can be admitted to the hospital for further work-up of her symptoms. Consult local neurology team to assist with evaluation and management. Start the patient on a baby aspirin. Allow permissive hypertension, and can treat for systolic blood pressure greater than 200 and diastolic blood pressure greater than 100. Check MRI brain without contrast to rule out any acute intracranial process. Check MRA of the head and neck to evaluate her intracranial and extracranial blood vessels. Check echocardiogram to gauge her cardiac function. Maintain the patient on telemetry to look for paroxysmal atrial fibrillation. Check hemoglobin A1c and lipid panel. Consult PT, OT, and ST. Continue supportive care. Plan of care was discussed with the patient and her family.   Thank you for allowing [facility] to participate in the care of your patient. Please call me, Dr. Adrienne MochaSombutmai, with any questions at [phone]. Case discussed with the ER staff and Dr. Scotty CourtStafford.   Critical Care notation:   I was called to see this  critical patient emergently. I personally evaluated this critical patient for acute stroke evaluation, and determining their eligibility for IV Alteplase and interventional therapies.  I have spent approximately 13 minutes with the patient, including time at bedside, time discussing the case with other physicians, reviewing plan of care, and time independently reviewing the records and scans.

## 2018-07-28 ENCOUNTER — Encounter: Payer: Self-pay | Admitting: Internal Medicine

## 2018-07-28 ENCOUNTER — Inpatient Hospital Stay
Admit: 2018-07-28 | Discharge: 2018-07-28 | Disposition: A | Discharge status: Home / Self Care | Payer: Medicare HMO | Attending: Internal Medicine | Admitting: Internal Medicine

## 2018-07-28 DIAGNOSIS — G459 Transient cerebral ischemic attack, unspecified: Secondary | ICD-10-CM | POA: Diagnosis not present

## 2018-07-28 DIAGNOSIS — R29703 NIHSS score 3: Secondary | ICD-10-CM | POA: Diagnosis present

## 2018-07-28 DIAGNOSIS — I639 Cerebral infarction, unspecified: Secondary | ICD-10-CM | POA: Diagnosis not present

## 2018-07-28 DIAGNOSIS — E785 Hyperlipidemia, unspecified: Secondary | ICD-10-CM | POA: Diagnosis present

## 2018-07-28 DIAGNOSIS — F17210 Nicotine dependence, cigarettes, uncomplicated: Secondary | ICD-10-CM | POA: Diagnosis present

## 2018-07-28 DIAGNOSIS — Z9049 Acquired absence of other specified parts of digestive tract: Secondary | ICD-10-CM | POA: Diagnosis not present

## 2018-07-28 DIAGNOSIS — Z823 Family history of stroke: Secondary | ICD-10-CM | POA: Diagnosis not present

## 2018-07-28 DIAGNOSIS — I6381 Other cerebral infarction due to occlusion or stenosis of small artery: Secondary | ICD-10-CM | POA: Diagnosis present

## 2018-07-28 DIAGNOSIS — Z720 Tobacco use: Secondary | ICD-10-CM | POA: Diagnosis not present

## 2018-07-28 DIAGNOSIS — I10 Essential (primary) hypertension: Secondary | ICD-10-CM | POA: Diagnosis present

## 2018-07-28 DIAGNOSIS — Z716 Tobacco abuse counseling: Secondary | ICD-10-CM | POA: Diagnosis not present

## 2018-07-28 DIAGNOSIS — G8191 Hemiplegia, unspecified affecting right dominant side: Secondary | ICD-10-CM | POA: Diagnosis present

## 2018-07-28 LAB — LIPID PANEL
Cholesterol: 177 mg/dL (ref 0–200)
HDL: 42 mg/dL (ref >40)
LDL Cholesterol: 107 mg/dL — ABNORMAL HIGH (ref 0–99)
TRIGLYCERIDES: 140 mg/dL (ref <150)
Total CHOL/HDL Ratio: 4.2 RATIO
VLDL: 28 mg/dL (ref 0–40)

## 2018-07-28 LAB — ECHOCARDIOGRAM COMPLETE
Height: 62 in
Weight: 2814.83 oz

## 2018-07-28 LAB — HEMOGLOBIN A1C
Hgb A1c MFr Bld: 5.3 % (ref 4.8–5.6)
Mean Plasma Glucose: 105.41 mg/dL

## 2018-07-28 MED ORDER — ASPIRIN EC 81 MG PO TBEC: 81.0000 mg | DELAYED_RELEASE_TABLET | Freq: Every day | ORAL | Status: AC

## 2018-07-28 MED ORDER — LISINOPRIL 5 MG PO TABS: 5.0000 mg | ORAL_TABLET | Freq: Every day | ORAL | 0 refills | Status: DC

## 2018-07-28 MED ORDER — CLOPIDOGREL BISULFATE 75 MG PO TABS: 75.0000 mg | ORAL_TABLET | Freq: Every day | ORAL | 0 refills | Status: AC

## 2018-07-28 MED ORDER — HYDRALAZINE HCL 20 MG/ML IJ SOLN: 10.0000 mg | Freq: Four times a day (QID) | INTRAMUSCULAR | Status: DC | PRN

## 2018-07-28 MED ORDER — ATORVASTATIN CALCIUM 40 MG PO TABS: 40.0000 mg | ORAL_TABLET | Freq: Every day | ORAL | 0 refills | Status: DC

## 2018-07-28 NOTE — Consult Note (Signed)
Referring Physician: Eddie NorthSudini, S.    Chief Complaint: Right-sided weakness and numbness  HPI: Shelly BuffBrenda J Mitchell is an 67 y.o. female with past medical history of cholecystectomy, tobacco abuse and probable hypertension presenting to the ED on 07/27/2018 with chief complaints of right sided weakness.  Patient states that she woke up at 5 AM in her usual state of health and dropped her grandchildren to school.  She came back and was able to do her house chores without any difficulty then suddenly felt very dizzy, she decided to lay on the couch for a few minutes.  When she got up to do the dishes right side became numb and weak was unable to bear weight on the right side and was very unsteady.  Denies associated altered sensorium, speech abnormality, cranial nerve deficit, seizures, diplopia, nausea or vomiting, syncope or LOC or other associated focal neurologic deficit.   She called her daughter who took her to urgent care then sent to the ED for further evaluation of a possible stroke.  On arrival to the ED code stroke was initiated and patient evaluated by a tele-neurologist.  Initial  NIH stroke scale was 3.  Initial CT head was negative with no acute intracranial abnormality noted.  Patient was offered IV alteplase administration however she deferred due to concerns of internal bleeding.  She therefore opted for a more conservative management.  Patient was admitted for further stroke work-up and management.  Date last known well: Date: 07/27/2018 Time last known well: Time: 11:00 tPA Given: No: Patient declined  Past Medical History:  Diagnosis Date  . Tobacco abuse     Past Surgical History:  Procedure Laterality Date  . CHOLECYSTECTOMY      Family History  Problem Relation Age of Onset  . CVA Mother    Social History:  reports that she has been smoking. She has never used smokeless tobacco. She reports that she does not drink alcohol or use drugs.  Allergies: No Known  Allergies  Medications:  I have reviewed the patient's current medications. Prior to Admission:  No medications prior to admission.   Scheduled: . aspirin  325 mg Oral Daily  . atorvastatin  40 mg Oral q1800  . enoxaparin (LOVENOX) injection  40 mg Subcutaneous Q24H  . nicotine  21 mg Transdermal Daily    ROS: History obtained from the patient   General ROS: negative for - chills, fatigue, fever, night sweats, weight gain or weight loss Psychological ROS: negative for - behavioral disorder, hallucinations, memory difficulties, mood swings or suicidal ideation Ophthalmic ROS: negative for - blurry vision, double vision, eye pain or loss of vision ENT ROS: negative for - epistaxis, nasal discharge, oral lesions, sore throat, tinnitus or vertigo Allergy and Immunology ROS: negative for - hives or itchy/watery eyes Hematological and Lymphatic ROS: negative for - bleeding problems, bruising or swollen lymph nodes Endocrine ROS: negative for - galactorrhea, hair pattern changes, polydipsia/polyuria or temperature intolerance Respiratory ROS: negative for - cough, hemoptysis, shortness of breath or wheezing Cardiovascular ROS: negative for - chest pain, dyspnea on exertion, edema or irregular heartbeat Gastrointestinal ROS: negative for - abdominal pain, diarrhea, hematemesis, nausea/vomiting or stool incontinence Genito-Urinary ROS: negative for - dysuria, hematuria, incontinence or urinary frequency/urgency Musculoskeletal ROS: negative for - joint swelling or muscular weakness Neurological ROS: as noted in HPI Dermatological ROS: negative for rash and skin lesion changes  Physical Examination: Blood pressure (!) 183/60, pulse (!) 58, temperature 97.8 F (36.6 C), temperature source Oral, resp.  rate 20, weight 79.8 kg, SpO2 97 %.   HEENT-  Normocephalic, no lesions, without obvious abnormality.  Normal external eye and conjunctiva.  Normal TM's bilaterally.  Normal auditory canals and  external ears. Normal external nose, mucus membranes and septum.  Normal pharynx. Cardiovascular- S1, S2 normal, pulses palpable throughout   Lungs- chest clear, no wheezing, rales, normal symmetric air entry Abdomen- soft, non-tender; bowel sounds normal; no masses,  no organomegaly Extremities- no edema Lymph-no adenopathy palpable Musculoskeletal-no joint tenderness, deformity or swelling Skin-warm and dry, no hyperpigmentation, vitiligo, or suspicious lesions  Neurological Exam   Mental Status: Alert, oriented, thought content appropriate.  Speech fluent without evidence of aphasia.  Able to follow 3 step commands without difficulty. Attention span and concentration seemed appropriate  Cranial Nerves: II: Discs flat bilaterally; Visual fields grossly normal, pupils equal, round, reactive to light and accommodation III,IV, VI: ptosis not present, extra-ocular motions intact bilaterally V,VII: smile symmetric, facial light touch sensation intact VIII: hearing normal bilaterally IX,X: gag reflex present XI: bilateral shoulder shrug XII: midline tongue extension Motor: Right :  Upper extremity   4+/5 Without pronator drift      Left: Upper extremity   5/5 without pronator drift Right:   Lower extremity   5/5                                          Left: Lower extremity   5/5 Tone and bulk:normal tone throughout; no atrophy noted Sensory: Pinprick and light touch intact bilaterally Deep Tendon Reflexes: 2+ and symmetric throughout Plantars: Right: mute                              Left: mute Cerebellar: Finger-to-nose testing intact bilaterally. Heel to shin testing normal bilaterally Gait: not tested due to safety concerns  Data Reviewed  Laboratory Studies:  Basic Metabolic Panel: Recent Labs  Lab 07/27/18 1337  NA 141  K 3.7  CL 106  CO2 29  GLUCOSE 105*  BUN 15  CREATININE 0.85  CALCIUM 8.8*    Liver Function Tests: Recent Labs  Lab 07/27/18 1337  AST 20   ALT 16  ALKPHOS 93  BILITOT 0.5  PROT 7.0  ALBUMIN 3.7   No results for input(s): LIPASE, AMYLASE in the last 168 hours. No results for input(s): AMMONIA in the last 168 hours.  CBC: Recent Labs  Lab 07/27/18 1337  WBC 10.0  NEUTROABS 6.6  HGB 12.8  HCT 40.3  MCV 96.0  PLT 303    Cardiac Enzymes: Recent Labs  Lab 07/27/18 1337  TROPONINI <0.03    BNP: Invalid input(s): POCBNP  CBG: Recent Labs  Lab 07/27/18 1221 07/27/18 1331  GLUCAP 111* 111*    Microbiology: No results found for this or any previous visit.  Coagulation Studies: Recent Labs    07/27/18 1337  LABPROT 13.6  INR 1.05    Urinalysis:  Recent Labs  Lab 07/27/18 1455  COLORURINE YELLOW*  LABSPEC 1.006  PHURINE 6.0  GLUCOSEU NEGATIVE  HGBUR SMALL*  BILIRUBINUR NEGATIVE  KETONESUR NEGATIVE  PROTEINUR NEGATIVE  NITRITE NEGATIVE  LEUKOCYTESUR TRACE*    Lipid Panel:    Component Value Date/Time   CHOL 177 07/28/2018 0419   TRIG 140 07/28/2018 0419   HDL 42 07/28/2018 0419   CHOLHDL 4.2 07/28/2018 0419  VLDL 28 07/28/2018 0419   LDLCALC 107 (H) 07/28/2018 0419    HgbA1C:  Lab Results  Component Value Date   HGBA1C 5.3 07/28/2018    Urine Drug Screen:      Component Value Date/Time   LABOPIA NONE DETECTED 07/27/2018 1455   COCAINSCRNUR NONE DETECTED 07/27/2018 1455   LABBENZ NONE DETECTED 07/27/2018 1455   AMPHETMU NONE DETECTED 07/27/2018 1455   THCU NONE DETECTED 07/27/2018 1455   LABBARB NONE DETECTED 07/27/2018 1455    Alcohol Level:  Recent Labs  Lab 07/27/18 1701  ETH <10    Other results: EKG: normal EKG, normal sinus rhythm, unchanged from previous tracings.  Imaging: Mr Brain Wo Contrast  Result Date: 07/27/2018 CLINICAL DATA:  67 y/o F; headache, right arm weakness, right leg weakness. EXAM: MRI HEAD WITHOUT CONTRAST MRA HEAD WITHOUT CONTRAST TECHNIQUE: Multiplanar, multiecho pulse sequences of the brain and surrounding structures were obtained  without intravenous contrast. Angiographic images of the head were obtained using MRA technique without contrast. COMPARISON:  07/27/2018 CT head and carotid ultrasound. FINDINGS: MRI HEAD FINDINGS Brain: 10 mm focus of reduced diffusion involving left putamen, corona radiata, and caudate body compatible with acute/early subacute infarction. No associated hemorrhage or mass effect. Several nonspecific T2 FLAIR hyperintensities in subcortical and periventricular white matter are compatible with mild chronic microvascular ischemic changes for age. Mild volume loss of the brain. No extra-axial collection, hydrocephalus, mass effect, or herniation. Vascular: As below. Skull and upper cervical spine: Normal marrow signal. Sinuses/Orbits: Negative. Other: None. MRA HEAD FINDINGS Anterior circulation: No large vessel occlusion, aneurysm, or high-grade stenosis is identified. Right-greater-than-left lumen irregularity of the carotid siphons compatible with atherosclerotic disease. Multiple segments of right ICA stenosis. Mild right M2 inferior division proximal stenosis. Posterior circulation: Tandem segments of mild-to-moderate right proximal PCA stenosis. Moderate to severe left P2 stenosis. No large vessel occlusion, aneurysm, or additional segment of high-grade stenosis. Anatomic variation: Large left A1, hypoplastic right A1, large anterior communicating artery, normal variant. IMPRESSION: 1. 10 mm focus of acute/early subacute infarction involving left putamen, corona radiata, and caudate body. No associated hemorrhage or mass effect. 2. Mild chronic microvascular ischemic changes and volume loss of the brain. 3. Patent anterior and posterior intracranial circulation. No large vessel occlusion or aneurysm. 4. Multiple segments of stenosis in the anterior and posterior circulation, greatest in the proximal posterior cerebral arteries. These results will be called to the ordering clinician or representative by the  Radiologist Assistant, and communication documented in the PACS or zVision Dashboard. Electronically Signed   By: Mitzi Hansen M.D.   On: 07/27/2018 19:37   US Carotid Bilateral (at [facility] And [facility] Only)  Result Date: 07/27/2018 CLINICAL DATA:  Stroke/TIA.  History hypertension and smoking. EXAM: BILATERAL CAROTID DUPLEX ULTRASOUND TECHNIQUE: Wallace Cullens scale imaging, color Doppler and duplex ultrasound were performed of bilateral carotid and vertebral arteries in the neck. COMPARISON:  None. FINDINGS: Criteria: Quantification of carotid stenosis is based on velocity parameters that correlate the residual internal carotid diameter with NASCET-based stenosis levels, using the diameter of the distal internal carotid lumen as the denominator for stenosis measurement. The following velocity measurements were obtained: RIGHT ICA:  108/35 cm/sec CCA:  90/15 cm/sec SYSTOLIC ICA/CCA RATIO:  1.1 ECA:  98 cm/sec LEFT ICA:  82/24 cm/sec CCA:  80/20 cm/sec SYSTOLIC ICA/CCA RATIO:  1.0 ECA:  87 cm/sec RIGHT CAROTID ARTERY: There is a minimal amount of hypoechoic plaque within the right carotid bulb (image 14 and 16), not resulting in  elevated peak systolic velocities within the interrogated course of the right internal carotid artery to suggest a hemodynamically significant stenosis. RIGHT VERTEBRAL ARTERY:  Antegrade Flow LEFT CAROTID ARTERY: There is a minimal amount of mixed echogenic plaque within the left carotid bulb (image 49), not resulting in elevated peak systolic velocities within the interrogated course of the left internal carotid artery to suggest a hemodynamically significant stenosis. LEFT VERTEBRAL ARTERY:  Antegrade Flow IMPRESSION: Minimal amount of bilateral atherosclerotic plaque left subjectively greater than right, not resulting in a hemodynamically significant stenosis within either internal carotid artery. Electronically Signed   By: Simonne Come M.D.   On: 07/27/2018 16:36   Mr Maxine Glenn Head/brain ZO  Cm  Result Date: 07/27/2018 CLINICAL DATA:  67 y/o F; headache, right arm weakness, right leg weakness. EXAM: MRI HEAD WITHOUT CONTRAST MRA HEAD WITHOUT CONTRAST TECHNIQUE: Multiplanar, multiecho pulse sequences of the brain and surrounding structures were obtained without intravenous contrast. Angiographic images of the head were obtained using MRA technique without contrast. COMPARISON:  07/27/2018 CT head and carotid ultrasound. FINDINGS: MRI HEAD FINDINGS Brain: 10 mm focus of reduced diffusion involving left putamen, corona radiata, and caudate body compatible with acute/early subacute infarction. No associated hemorrhage or mass effect. Several nonspecific T2 FLAIR hyperintensities in subcortical and periventricular white matter are compatible with mild chronic microvascular ischemic changes for age. Mild volume loss of the brain. No extra-axial collection, hydrocephalus, mass effect, or herniation. Vascular: As below. Skull and upper cervical spine: Normal marrow signal. Sinuses/Orbits: Negative. Other: None. MRA HEAD FINDINGS Anterior circulation: No large vessel occlusion, aneurysm, or high-grade stenosis is identified. Right-greater-than-left lumen irregularity of the carotid siphons compatible with atherosclerotic disease. Multiple segments of right ICA stenosis. Mild right M2 inferior division proximal stenosis. Posterior circulation: Tandem segments of mild-to-moderate right proximal PCA stenosis. Moderate to severe left P2 stenosis. No large vessel occlusion, aneurysm, or additional segment of high-grade stenosis. Anatomic variation: Large left A1, hypoplastic right A1, large anterior communicating artery, normal variant. IMPRESSION: 1. 10 mm focus of acute/early subacute infarction involving left putamen, corona radiata, and caudate body. No associated hemorrhage or mass effect. 2. Mild chronic microvascular ischemic changes and volume loss of the brain. 3. Patent anterior and posterior intracranial  circulation. No large vessel occlusion or aneurysm. 4. Multiple segments of stenosis in the anterior and posterior circulation, greatest in the proximal posterior cerebral arteries. These results will be called to the ordering clinician or representative by the Radiologist Assistant, and communication documented in the PACS or zVision Dashboard. Electronically Signed   By: Mitzi Hansen M.D.   On: 07/27/2018 19:37   Ct Head Code Stroke Wo Contrast  Result Date: 07/27/2018 CLINICAL DATA:  Code stroke. Frontal headache with slight right upper and lower extremity weakness and paresthesia. EXAM: CT HEAD WITHOUT CONTRAST TECHNIQUE: Contiguous axial images were obtained from the base of the skull through the vertex without intravenous contrast. COMPARISON:  None. FINDINGS: Brain: There is no evidence of acute infarct, intracranial hemorrhage, mass, midline shift, or extra-axial fluid collection. Focal encephalomalacia is noted in the body of the corpus callosum on the left. Periventricular white matter hypodensities elsewhere are nonspecific but compatible with mild chronic small vessel ischemic disease. Cerebral volume is normal. Vascular: Calcified atherosclerosis at the skull base. No hyperdense vessel. Skull: No fracture or suspicious osseous lesion. Sinuses/Orbits: Visualized paranasal sinuses and mastoid air cells are clear. Visualized orbits are unremarkable. Other: None. ASPECTS [facility] Stroke Program Early CT Score) - Ganglionic level infarction (caudate, lentiform  nuclei, internal capsule, insula, M1-M3 cortex): 7 - Supraganglionic infarction (M4-M6 cortex): 3 Total score (0-10 with 10 being normal): 10 IMPRESSION: 1. No evidence of acute intracranial abnormality. 2. ASPECTS is 10. 3. Mild chronic small vessel ischemic disease. 4. 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 These results were called by telephone at the time of interpretation on 07/27/2018 at 1:50 pm to Dr.  Sharman Cheek , who verbally acknowledged these results. Electronically Signed   By: Sebastian Ache M.D.   On: 07/27/2018 13:51    Assessment: 67 y.o. female with past medical history of cholecystectomy, tobacco abuse and probable hypertension presenting to the ED on 07/27/2018 with chief complaints of right sided weakness.  MRI of the brain showed acute/subacute infarction involving left putamen, coronary radiator, caudate body.  Etiology likely small vessel disease.  Symptoms now resolved without residual effect.  MRA head did not show large vessel occlusion or aneurysm.  Carotid ultrasound showed minimal amount of bilateral atherosclerotic plaque left greater than right with no hemodynamically significant stenosis.  Echocardiogram did not show cardiac source of emboli.  Hemoglobin A1c 5.3, LDL 107.  Patient states she was not on antiplatelet or anticoagulation prior to this event.  Stroke Risk Factors - family history, hyperlipidemia, hypertension and smoking  Plan: 1. Continue medical management with dual therapy Aspirin 81 mg/day with intensive management of vascular risk factor to keep systolic BP (SBP) <140 mm Hg (161 mm Hg if diabetic)  2. Start statin with goal low density lipoprotein (LDL) <70 mg/dl. 3. Cigarette smoker - advised to stop smoking 4.  Recommend follow-up with outpatient neurology  This patient was staffed with Dr. Maryln Gottron, [facility] who personally evaluated patient, reviewed documentation and agreed with assessment and plan of care as above.  Webb Silversmith, DNP, FNP-BC Board certified Nurse Practitioner Neurology Department    07/28/2018, 4:44 PM

## 2018-07-28 NOTE — Progress Notes (Signed)
PT Cancellation Note  Patient Details Name: Shelly Mitchell MRN: [id number] DOB: May 06, 1951   Cancelled Treatment:    Reason Eval/Treat Not Completed: Other (comment).  Treatment attempted x 2 with conflict for meal and then testing.  Will try later as time and pt allow.   Shelly Mitchell 07/28/2018, 9:28 AM   Shelly Mitchell, PT MS Acute Rehab Dept. Number: [facility] [phone] and [facility] [phone]

## 2018-07-28 NOTE — Progress Notes (Signed)
Rehab Admissions Coordinator Note:  Per PT recommendation, Patient was screened by Nanine MeansKelly Jerianne Anselmo for appropriateness for an Inpatient Acute Rehab Consult.  At this time, it appears pt does not require CIR as pt already Mod I for transfers and Min G for ambulation with PT on evaluation. If functional deficits persist or there is a decline in function, please place another screen. AC will sign off.    Nanine MeansKelly Nashaun Hillmer 07/28/2018, 8:25 AM  I can be reached at [phone].

## 2018-07-28 NOTE — Evaluation (Signed)
Occupational Therapy Evaluation Patient Details Name: Shelly Mitchell MRN: [id number] DOB: 1951/05/06 Today's Date: 07/28/2018    History of Present Illness 67 yo female admitted from ED after developing R side weakness and numbness with a frontal HA.  Has MRI planned, counseled to stop smoking.   Has been up to walk since admission but not interested in using AD. MRI+ for acute infarct. 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 Prior to hospital admission, pt was independent in all aspects of ADL/IADL, mobility, and denies falls history in past 12 months. Pt lives with her daughter and grandson in a 1 story home with 4 steps to enter with bilat hand rails. Currently pt reporting symptoms have resolved. Pt ambulating independently in room and adjusting hospital gown upon OT's arrival. Pt demonstrates baseline independence to perform ADL and mobility tasks and no strength, sensory, cognitive, or visual deficits appreciated with assessment. Very mild coordination deficits noted in R hand but does not functional impair pt during ADL. [facility] ex handout provided, pt instructed in ex. Pt verbalized understanding. No additional skilled OT needs identified. Will sign off. Please re-consult if additional OT needs arise.    Follow Up Recommendations  No OT follow up    Equipment Recommendations  None recommended by OT    Recommendations for Other Services       Precautions / Restrictions Precautions Precautions: None Restrictions Weight Bearing Restrictions: No      Mobility Bed Mobility Overal bed mobility: Independent                Transfers Overall transfer level: Independent Equipment used: None                  Balance Overall balance assessment: No apparent balance deficits (not formally assessed)                                         ADL either  performed or assessed with clinical judgement   ADL Overall ADL's : Independent                                       General ADL Comments: pt amb indep in room with no AD, no LOB, no difficulty performing ADL tasks     Vision Baseline Vision/History: Wears glasses Wears Glasses: At all times(but doesn't have) Patient Visual Report: No change from baseline       Perception     Praxis      Pertinent Vitals/Pain Pain Assessment: No/denies pain     Hand Dominance Right   Extremity/Trunk Assessment Upper Extremity Assessment Upper Extremity Assessment: RUE deficits/detail RUE Deficits / Details: strength WFL bilaterally, sensation intact, very mild FMC deficits in RUE but with no functional deficits RUE Coordination: decreased fine motor   Lower Extremity Assessment Lower Extremity Assessment: Overall WFL for tasks assessed   Cervical / Trunk Assessment Cervical / Trunk Assessment: Normal   Communication Communication Communication: No difficulties   Cognition Arousal/Alertness: Awake/alert Behavior During Therapy: WFL for tasks assessed/performed Overall Cognitive Status: Within Functional Limits for tasks assessed  General Comments       Exercises Other Exercises Other Exercises: [facility] R hand, handout provided, pt verbalized understanding   Shoulder Instructions      Home Living Family/patient expects to be discharged to:: Private residence Living Arrangements: Children(daughter, grandson) Available Help at Discharge: Family;Available PRN/intermittently Type of Home: House Home Access: Stairs to enter Entergy CorporationEntrance Stairs-Number of Steps: 4 Entrance Stairs-Rails: Can reach both;Right;Left Home Layout: One level     Bathroom Shower/Tub: Chief Strategy OfficerTub/shower unit   Bathroom Toilet: Standard     Home Equipment: Environmental consultantWalker - 2 wheels;Cane - single point          Prior  Functioning/Environment Level of Independence: Independent        Comments: indep with mobility, ADL, IADL, driving, med mgt, no RUEAV/40JWfalls/60mo        OT Problem List: Decreased coordination      OT Treatment/Interventions:      OT Goals(Current goals can be found in the care plan section) Acute Rehab OT Goals Patient Stated Goal: go home OT Goal Formulation: All assessment and education complete, DC therapy  OT Frequency:     Barriers to D/C:            Co-evaluation              AM-PAC OT "6 Clicks" Daily Activity     Outcome Measure Help from another person eating meals?: None Help from another person taking care of personal grooming?: None Help from another person toileting, which includes using toliet, bedpan, or urinal?: None Help from another person bathing (including washing, rinsing, drying)?: None Help from another person to put on and taking off regular upper body clothing?: None Help from another person to put on and taking off regular lower body clothing?: None 6 Click Score: 24   End of Session    Activity Tolerance: Patient tolerated treatment well Patient left: with call bell/phone within reach(left ambulating in room)  OT Visit Diagnosis: Unsteadiness on feet (R26.81)                Time: [id number] OT Time Calculation (min): 12 min Charges:  OT General Charges $OT Visit: 1 Visit OT Evaluation $OT Eval Low Complexity: 1 Low  Richrd PrimeJamie Stiller, MPH, MS, OTR/L ascom [phone] 07/28/18, 10:12 AM

## 2018-07-28 NOTE — Progress Notes (Signed)
*  PRELIMINARY RESULTS* Echocardiogram 2D Echocardiogram has been performed.  Shelly GulaJoan M Trenell Concannon 07/28/2018, 9:33 AM

## 2018-07-28 NOTE — Progress Notes (Signed)
SLP Cancellation Note  Patient Details Name: Shelly Mitchell MRN: [id number] DOB: Mar 06, 1951   Cancelled treatment:       Reason Eval/Treat Not Completed: SLP screened, no needs identified, will sign off(chart reviewed; consuled NSG then met w/ pt in room). Pt denied any difficulty swallowing and is currently on a regular diet; tolerates swallowing pills w/ water per NSG. Pt conversed at conversational level w/out deficits noted; pt denied any speech-language deficits.  No further skilled ST services indicated as pt appears at her baseline. Pt agreed. NSG to reconsult if any change in status.    Orinda Kenner, MS, CCC-SLP Watson,Katherine 07/28/2018, 9:41 AM

## 2018-07-29 LAB — HIV ANTIBODY (ROUTINE TESTING W REFLEX): HIV SCREEN 4TH GENERATION: NONREACTIVE

## 2018-08-06 DIAGNOSIS — Z8673 Personal history of transient ischemic attack (TIA), and cerebral infarction without residual deficits: Secondary | ICD-10-CM | POA: Diagnosis not present

## 2018-08-06 DIAGNOSIS — R51 Headache: Secondary | ICD-10-CM | POA: Diagnosis not present

## 2018-08-06 DIAGNOSIS — R202 Paresthesia of skin: Secondary | ICD-10-CM | POA: Diagnosis not present

## 2018-08-17 NOTE — Discharge Summary (Signed)
[facility] - [place] at [facility]   PATIENT NAME: Shelly Mitchell    MR#:  [id number]  DATE OF BIRTH:  05/24/1951  DATE OF ADMISSION:  07/27/2018 ADMITTING PHYSICIAN: Auburn BilberryShreyang Patel, MD  DATE OF DISCHARGE: 07/28/2018  8:25 PM  PRIMARY CARE PHYSICIAN: Patient, No Pcp Per   ADMISSION DIAGNOSIS:  TIA (transient ischemic attack) [G45.9] Stroke (cerebrum) (HCC) [I63.9] Hypertension, unspecified type [I10]  DISCHARGE DIAGNOSIS:  Active Problems:   CVA (cerebral vascular accident) (HCC)   SECONDARY DIAGNOSIS:   Past Medical History:  Diagnosis Date  . Tobacco abuse      ADMITTING HISTORY  HISTORY OF PRESENT ILLNESS: Shelly SayresBrenda Belongia  is a 67 y.o. female with no medical problems except nicotine abuse who is presenting to the hospital with complaint of headache and right arm weakness and right leg weakness.  Patient states that the symptoms started this morning.  They have since improved and mostly resolved.  Swallowing difficulties no numbness or tingling.  HOSPITAL COURSE:   * acute/subacute infarction involving left putamen, coronary radiator, caudate body Patient was admitted to medical floor with telemetry monitoring.  Aspirin Plavix and Lipitor started.  Neurochecks continued.  Seen by neurology.  Carotids did not show any significant stenosis.  Echocardiogram with no thrombus.  Patient continued to have significantly elevated blood pressure and was on low-dose lisinopril 5 mg which was started  Patient's right-sided weakness is improved significantly with some residual weakness.  Physical therapy has seen the patient.  Discharged with follow-up physical therapy.  Follow-up with PCP and neurology in 1 week CONSULTS OBTAINED:  Treatment Team:  Kym Groomriadhosp, Neuro1, MD  DRUG ALLERGIES:  No Known Allergies  DISCHARGE MEDICATIONS:   Allergies as of 07/28/2018   No Known Allergies     Medication List    TAKE these medications   aspirin EC 81 MG  tablet Take 1 tablet (81 mg total) by mouth daily.   atorvastatin 40 MG tablet Commonly known as:  LIPITOR Take 1 tablet (40 mg total) by mouth daily at 6 PM.   clopidogrel 75 MG tablet Commonly known as:  PLAVIX Take 1 tablet (75 mg total) by mouth daily.   lisinopril 5 MG tablet Commonly known as:  PRINIVIL,ZESTRIL Take 1 tablet (5 mg total) by mouth daily.       Today   VITAL SIGNS:  Blood pressure (!) 183/60, pulse (!) 58, temperature 97.8 F (36.6 C), temperature source Oral, resp. rate 20, weight 79.8 kg, SpO2 97 %.  I/O:  No intake or output data in the 24 hours ending 08/17/18 [id number]  PHYSICAL EXAMINATION:  Physical Exam  GENERAL:  67 y.o.-year-old patient lying in the bed with no acute distress.  LUNGS: Normal breath sounds bilaterally, no wheezing, rales,rhonchi or crepitation. No use of accessory muscles of respiration.  CARDIOVASCULAR: S1, S2 normal. No murmurs, rubs, or gallops.  ABDOMEN: Soft, non-tender, non-distended. Bowel sounds present. No organomegaly or mass.  NEUROLOGIC: Moves all 4 extremities. PSYCHIATRIC: The patient is alert and oriented x 3.  SKIN: No obvious rash, lesion, or ulcer.   DATA REVIEW:   CBC No results for input(s): WBC, HGB, HCT, PLT in the last 168 hours.  Chemistries  No results for input(s): NA, K, CL, CO2, GLUCOSE, BUN, CREATININE, CALCIUM, MG, AST, ALT, ALKPHOS, BILITOT in the last 168 hours.  Invalid input(s): GFRCGP  Cardiac Enzymes No results for input(s): TROPONINI in the last 168 hours.  Microbiology Results  No results found for this or any  previous visit.  RADIOLOGY:  No results found.  Follow up with PCP in 1 week.  Management plans discussed with the patient, family and they are in agreement.  CODE STATUS:  Code Status History    Date Active Date Inactive Code Status Order ID Comments User Context   07/27/2018 1533 07/28/2018 2330 Full Code [id number]  Auburn BilberryPatel, Shreyang, MD ED      TOTAL TIME TAKING  CARE OF THIS PATIENT ON DAY OF DISCHARGE: more than 30 minutes.   Orie FishermanSrikar R Hiilei Gerst M.D on 08/17/2018 at 8:22 AM  Between 7am to 6pm - Pager - [phone]  After 6pm go to [email or web address] - password [email or web address]  [facility] [place]  Office  [phone]  CC: Primary care physician; Patient, No Pcp Per  Note: This dictation was prepared with Dragon dictation along with smaller phrase technology. Any transcriptional errors that result from this process are unintentional.

## 2018-09-10 ENCOUNTER — Other Ambulatory Visit: Payer: Self-pay

## 2018-09-10 ENCOUNTER — Encounter: Payer: Self-pay | Admitting: Emergency Medicine

## 2018-09-10 ENCOUNTER — Ambulatory Visit
Admission: EM | Admit: 2018-09-10 | Discharge: 2018-09-10 | Disposition: A | Discharge status: Home / Self Care | Payer: Medicare HMO | Attending: Family Medicine | Admitting: Family Medicine

## 2018-09-10 DIAGNOSIS — R42 Dizziness and giddiness: Secondary | ICD-10-CM

## 2018-09-10 DIAGNOSIS — F5101 Primary insomnia: Secondary | ICD-10-CM | POA: Diagnosis not present

## 2018-09-10 HISTORY — DX: Cerebral infarction, unspecified: I63.9

## 2018-09-10 MED ORDER — TRAZODONE HCL 50 MG PO TABS: 50.0000 mg | ORAL_TABLET | Freq: Every day | ORAL | 0 refills | Status: DC

## 2018-09-10 MED ORDER — MECLIZINE HCL 25 MG PO TABS: 25.0000 mg | ORAL_TABLET | Freq: Three times a day (TID) | ORAL | 0 refills | Status: DC | PRN

## 2018-09-10 NOTE — Discharge Instructions (Signed)
Medications as prescribed.  Please see your neurologist.  Take care  Dr. Adriana Simas

## 2018-09-10 NOTE — ED Triage Notes (Signed)
Pt c/o dizziness and intermittent headaches ever since her stroke on Dec 2nd. Denies numbness, slurred speech.

## 2018-09-10 NOTE — ED Provider Notes (Signed)
[facility]    CSN: [id number] Arrival date & time: 09/10/18  1659  History   Chief Complaint Chief Complaint  Patient presents with  . Dizziness   HPI  68 year old female presents with dizziness, headaches, insomnia.  Patient reports that she has had ongoing dizziness and headaches since she had a stroke in December.  She is followed by neurology.  Patient states that she feels dizzy and lightheaded intermittently.  Reports that she feels unsteady on her feet.  She has had intermittent headaches as well.  She has been taking Tylenol for these.  She has contacted her neurologist regarding this.  It does not appear that she has a primary care physician.  Patient also reports difficulty sleeping.  This seems to be a chronic and ongoing problem that she has had for years.  She reports difficulty staying asleep.  Patient denies depression.  She states that she does feel quite anxious.  No other reported symptoms.  No other complaints or concerns at this time.  PMH, Surgical Hx, Family Hx, Social History reviewed and updated as below.  Past Medical History:  Diagnosis Date  . Stroke (HCC)   . Tobacco abuse   Hyperlipidemia  Patient Active Problem List   Diagnosis Date Noted  . CVA (cerebral vascular accident) (HCC) 07/27/2018   Past Surgical History:  Procedure Laterality Date  . CHOLECYSTECTOMY     OB History   No obstetric history on file.    Home Medications    Prior to Admission medications   Medication Sig Start Date End Date Taking? Authorizing Provider  atorvastatin (LIPITOR) 40 MG tablet Take 1 tablet (40 mg total) by mouth daily at 6 PM. 07/28/18  Yes Sudini, Wardell HeathSrikar, MD  clopidogrel (PLAVIX) 75 MG tablet Take by mouth. 08/24/18  Yes [provider]  lisinopril (PRINIVIL,ZESTRIL) 5 MG tablet Take 1 tablet (5 mg total) by mouth daily. 07/28/18 09/10/18 Yes Sudini, Wardell HeathSrikar, MD  aspirin EC 81 MG tablet Take 1 tablet (81 mg total) by mouth daily. 07/28/18    Milagros LollSudini, Srikar, MD  meclizine (ANTIVERT) 25 MG tablet Take 1 tablet (25 mg total) by mouth 3 (three) times daily as needed for dizziness. 09/10/18   Tommie Samsook, Mart Colpitts G, DO  traZODone (DESYREL) 50 MG tablet Take 1-2 tablets (50-100 mg total) by mouth at bedtime. 09/10/18   Tommie Samsook, [place] Mccuen G, DO    Family History Family History  Problem Relation Age of Onset  . CVA Mother     Social History Social History   Tobacco Use  . Smoking status: Former Smoker    Last attempt to quit: 07/27/2018    Years since quitting: 0.1  . Smokeless tobacco: Never Used  Substance Use Topics  . Alcohol use: No  . Drug use: No     Allergies   Patient has no known allergies.   Review of Systems Review of Systems  Neurological: Positive for dizziness, light-headedness and headaches.  Psychiatric/Behavioral: Positive for sleep disturbance.   Physical Exam Triage Vital Signs ED Triage Vitals  Enc Vitals Group     BP 09/10/18 1718 (!) 151/63     Pulse Rate 09/10/18 1718 71     Resp 09/10/18 1718 18     Temp 09/10/18 1718 98.2 F (36.8 C)     Temp Source 09/10/18 1718 Oral     SpO2 09/10/18 1718 100 %     Weight 09/10/18 1715 176 lb (79.8 kg)     Height 09/10/18 1715 5\' 2"  (  1.575 m)     Head Circumference --      Peak Flow --      Pain Score 09/10/18 1715 6     Pain Loc --      Pain Edu? --      Excl. in GC? --    Updated Vital Signs BP (!) 151/63 (BP Location: Left Arm)   Pulse 71   Temp 98.2 F (36.8 C) (Oral)   Resp 18   Ht 5\' 2"  (1.575 m)   Wt 79.8 kg   SpO2 100%   BMI 32.19 kg/m   Visual Acuity Right Eye Distance:   Left Eye Distance:   Bilateral Distance:    Right Eye Near:   Left Eye Near:    Bilateral Near:     Physical Exam Vitals signs and nursing note reviewed.  Constitutional:      General: She is not in acute distress. HENT:     Head: Normocephalic and atraumatic.     Nose: Nose normal.  Eyes:     Conjunctiva/sclera: Conjunctivae normal.     Pupils: Pupils are  equal, round, and reactive to light.  Cardiovascular:     Rate and Rhythm: Normal rate and regular rhythm.  Neurological:     Mental Status: She is alert and oriented to person, place, and time.     Comments: No apparent focal neuro deficits.  Psychiatric:     Comments: Flat affect. Depressed mood.    UC Treatments / Results  Labs (all labs ordered are listed, but only abnormal results are displayed) Labs Reviewed - No data to display  EKG None  Radiology No results found.  Procedures Procedures (including critical care time)  Medications Ordered in UC Medications - No data to display  Initial Impression / Assessment and Plan / UC Course  I have reviewed the triage vital signs and the nursing notes.  Pertinent labs & imaging results that were available during my care of the patient were reviewed by me and considered in my medical decision making (see chart for details).    68 year old female presents with ongoing dizziness/lightheadedness as well as headaches following fairly recent CVA.  Meclizine as needed.  Tylenol as needed for headaches.  I advised her that she needs to follow-up with her neurologist.  Empiric trial of trazodone for insomnia.  Final Clinical Impressions(s) / UC Diagnoses   Final diagnoses:  Dizziness  Lightheadedness  Primary insomnia     Discharge Instructions     Medications as prescribed.  Please see your neurologist.  Take care  Dr. Adriana Simas    ED Prescriptions    Medication Sig Dispense Auth. Provider   traZODone (DESYREL) 50 MG tablet Take 1-2 tablets (50-100 mg total) by mouth at bedtime. 90 tablet Itzabella Sorrels G, DO   meclizine (ANTIVERT) 25 MG tablet Take 1 tablet (25 mg total) by mouth 3 (three) times daily as needed for dizziness. 30 tablet Tommie Sams, DO     Controlled Substance Prescriptions [place] Controlled Substance Registry consulted? Not Applicable   Tommie Sams, DO 09/10/18 2108

## 2018-10-02 DIAGNOSIS — Z8673 Personal history of transient ischemic attack (TIA), and cerebral infarction without residual deficits: Secondary | ICD-10-CM | POA: Diagnosis not present

## 2018-10-11 ENCOUNTER — Other Ambulatory Visit: Payer: Self-pay | Admitting: Family Medicine

## 2019-02-15 ENCOUNTER — Ambulatory Visit
Admission: EM | Admit: 2019-02-15 | Discharge: 2019-02-15 | Disposition: A | Discharge status: Home / Self Care | Payer: Medicare HMO | Attending: Urgent Care | Admitting: Urgent Care

## 2019-02-15 ENCOUNTER — Other Ambulatory Visit: Payer: Self-pay

## 2019-02-15 ENCOUNTER — Encounter: Payer: Self-pay | Admitting: Emergency Medicine

## 2019-02-15 DIAGNOSIS — H6501 Acute serous otitis media, right ear: Secondary | ICD-10-CM | POA: Diagnosis not present

## 2019-02-15 MED ORDER — AMOXICILLIN-POT CLAVULANATE 875-125 MG PO TABS: 1.0000 | ORAL_TABLET | Freq: Two times a day (BID) | ORAL | 0 refills | Status: AC

## 2019-02-15 NOTE — ED Triage Notes (Signed)
Pt c/o cervical lymph node swelling on the right side. She states that her right ear is sore also. Started about 2 weeks ago. Denies fever.

## 2019-02-15 NOTE — ED Provider Notes (Signed)
Name: Shelly Mitchell DOB: 1950/11/29 MRN: [id number] CSN: [id number] PCP: Patient, No Pcp Per  Arrival date and time:  02/15/19 [id number]  Chief Complaint:  Lymphadenopathy  NOTE: Prior to seeing the patient today, I have reviewed the triage nursing documentation and vital signs. Clinical staff has updated patient's PMH/PSHx, current medication list, and drug allergies/intolerances to ensure comprehensive history available to assist in medical decision making.   History:   HPI: Shelly Mitchell is a 68 y.o. female who presents today with complaints of worsening RIGHT otalgia x 2 weeks. Patient has had low grade temperatures running in the 99 range. No otorrhea. She has appreciated pain and swelling behind her ear and in the RIGHT side of her neck. She notes that pain seems to "run down out of her ear into her neck". Patient denies history of ear/sinus problems. No recent upper respiratory illnesses; no cough, sore throat, sneezing, or forceful nose blowing. Patient does not have issues with seasonal allergies. Patient eating and drinking well. She has been taking IBU at home to help with her symptoms; notes effective in helping with the pain.   Past Medical History:  Diagnosis Date  . Stroke (HCC)   . Tobacco abuse     Past Surgical History:  Procedure Laterality Date  . CHOLECYSTECTOMY      Family History  Problem Relation Age of Onset  . CVA Mother     Social History   Socioeconomic History  . Marital status: Divorced    Spouse name: Not on file  . Number of children: Not on file  . Years of education: Not on file  . Highest education level: Not on file  Occupational History  . Not on file  Social Needs  . Financial resource strain: Not on file  . Food insecurity    Worry: Not on file    Inability: Not on file  . Transportation needs    Medical: Not on file    Non-medical: Not on file  Tobacco Use  . Smoking status: Former Smoker    Quit date: 07/27/2018   Years since quitting: 0.5  . Smokeless tobacco: Never Used  Substance and Sexual Activity  . Alcohol use: No  . Drug use: No  . Sexual activity: Not on file  Lifestyle  . Physical activity    Days per week: Not on file    Minutes per session: Not on file  . Stress: Not on file  Relationships  . Social Musicianconnections    Talks on phone: Not on file    Gets together: Not on file    Attends religious service: Not on file    Active member of club or organization: Not on file    Attends meetings of clubs or organizations: Not on file    Relationship status: Not on file  . Intimate partner violence    Fear of current or ex partner: Not on file    Emotionally abused: Not on file    Physically abused: Not on file    Forced sexual activity: Not on file  Other Topics Concern  . Not on file  Social History Narrative  . Not on file    Patient Active Problem List   Diagnosis Date Noted  . CVA (cerebral vascular accident) (HCC) 07/27/2018    Home Medications:    Current Meds  Medication Sig  . aspirin EC 81 MG tablet Take 1 tablet (81 mg total) by mouth daily.  Marland Kitchen. atorvastatin (LIPITOR)  40 MG tablet Take 1 tablet (40 mg total) by mouth daily at 6 PM.  . clopidogrel (PLAVIX) 75 MG tablet Take by mouth.  . meclizine (ANTIVERT) 25 MG tablet Take 1 tablet (25 mg total) by mouth 3 (three) times daily as needed for dizziness.  . traZODone (DESYREL) 50 MG tablet Take 1-2 tablets (50-100 mg total) by mouth at bedtime.    Allergies:   Patient has no known allergies.  Review of Systems (ROS): Review of Systems  Constitutional: Negative for chills and fever.  HENT: Positive for ear pain and hearing loss (muffled on RIGHT). Negative for congestion, ear discharge, facial swelling, postnasal drip, rhinorrhea, sinus pressure, sinus pain, sneezing, sore throat and tinnitus.   Eyes: Negative for pain, redness and itching.  Respiratory: Negative for cough, chest tightness and shortness of breath.    Cardiovascular: Negative for chest pain and palpitations.  Gastrointestinal: Negative for abdominal pain, diarrhea, nausea and vomiting.  Musculoskeletal: Positive for neck pain (RIGHT). Negative for arthralgias, back pain and myalgias.  Skin: Negative for color change, pallor and rash.  Allergic/Immunologic: Negative for environmental allergies.  Neurological: Negative for dizziness, weakness and headaches.  Hematological: Positive for adenopathy (RIGHT neck).     Physical Exam:  Triage Vital Signs ED Triage Vitals  Enc Vitals Group     BP 02/15/19 1006 137/84     Pulse Rate 02/15/19 1006 64     Resp 02/15/19 1006 18     Temp 02/15/19 1006 98.5 F (36.9 C)     Temp Source 02/15/19 1006 Oral     SpO2 02/15/19 1006 99 %     Weight 02/15/19 1004 175 lb (79.4 kg)     Height 02/15/19 1004 5\' 2"  (1.575 m)     Head Circumference --      Peak Flow --      Pain Score 02/15/19 1003 8     Pain Loc --      Pain Edu? --      Excl. in GC? --     Physical Exam  Constitutional: She is oriented to person, place, and time and well-developed, well-nourished, and in no distress.  HENT:  Head: Normocephalic and atraumatic.  Right Ear: Ear canal normal. There is drainage and tenderness. Tympanic membrane is erythematous. A middle ear effusion is present. Decreased hearing (muffled) is noted.  Left Ear: Hearing, tympanic membrane, external ear and ear canal normal.  Nose: No mucosal edema, rhinorrhea or sinus tenderness.  Mouth/Throat: Oropharynx is clear and moist and mucous membranes are normal.  Eyes: Pupils are equal, round, and reactive to light. EOM are normal.  Neck: Normal range of motion. Neck supple. No tracheal deviation present.  Cardiovascular: Normal rate, regular rhythm, normal heart sounds and intact distal pulses. Exam reveals no gallop and no friction rub.  No murmur heard. Pulmonary/Chest: Effort normal and breath sounds normal. No respiratory distress. She has no wheezes.  She has no rales.  Lymphadenopathy:       Head (right side): Submandibular and posterior auricular adenopathy present.       Head (left side): No submandibular and no posterior auricular adenopathy present.  Noted LAD tender to palpation  Neurological: She is alert and oriented to person, place, and time.  Skin: Skin is warm and dry. No rash noted. No erythema.  Psychiatric: Mood, affect and judgment normal.  Nursing note and vitals reviewed.    Urgent Care Treatments / Results:   LABS: PLEASE NOTE: all labs that were  ordered this encounter are listed, however only abnormal results are displayed. Labs Reviewed - No data to display  EKG: -None  RADIOLOGY: No results found.  PROCEDURES: Procedures  MEDICATIONS RECEIVED THIS VISIT: Medications - No data to display  PERTINENT CLINICAL COURSE NOTES/UPDATES:   Initial Impression / Assessment and Plan / Urgent Care Course:    Shelly Mitchell is a 68 y.o. female who presents to [facility] today with complaints of Lymphadenopathy   Pertinent labs & imaging results that were available during my care of the patient were personally reviewed by me and considered in my medical decision making (see lab/imaging section of note for values and interpretations).  Patient overall well appearing and in no acute distress today in clinic. Exam reveals tenderness with manual manipulation of patient's RIGHT ear. The TM is erythematous. There is (+) LAD that is TTP on the RIGHT post-auricular and submandibular areas. No current fever; low grade temperatures at home. No headaches, cough, sore throat, or chills. Denies myalgias. Symptom constellation and exam consistent with AOM of the RIGHT ear. Patient has no allergy symptoms. Will treat with a 10 day course of Augmentin. She was encouraged to continue to use Tylenol and/or Ibuprofen as needed for pain/fever.   Discussed follow up with primary care physician in 1 week for re-evaluation. I  have reviewed the follow up and strict return precautions for any new or worsening symptoms. Patient is aware of symptoms that would be deemed urgent/emergent, and would thus require further evaluation either here or in the emergency department. At the time of discharge, she verbalized understanding and consent with the discharge plan as it was reviewed with her. All questions were fielded by provider and/or clinic staff prior to patient discharge.    Final Clinical Impressions(s) / Urgent Care Diagnoses:   Final diagnoses:  Non-recurrent acute serous otitis media of right ear    New Prescriptions:   Meds ordered this encounter  Medications  . amoxicillin-clavulanate (AUGMENTIN) 875-125 MG tablet    Sig: Take 1 tablet by mouth 2 (two) times daily for 10 days.    Dispense:  20 tablet    Refill:  0    Controlled Substance Prescriptions:  [place] Controlled Substance Registry consulted? Not Applicable  NOTE: This note was prepared using Dragon dictation software along with smaller phrase technology. Despite my best ability to proofread, there is the potential that transcriptional errors may still occur from this process, and are completely unintentional.     Karen Kitchens, NP 02/15/19 1033

## 2019-02-15 NOTE — Discharge Instructions (Signed)
It was very nice seeing you today in clinic. Thank you for entrusting me with your care.   Please utilize the medications that we discussed. Your prescriptions have been called in to your pharmacy. May use Tylenol and/or Ibuprofen as needed for pain/fever. Increase fluid intake while on antibiotic. Rest as much as possible.   Make arrangements to follow up with your regular doctor in 1 week for re-evaluation. If your symptoms/condition worsens, please seek follow up care either here or in the ER. Please remember, our [place] providers are "right here with you" when you need [place].   Again, it was my pleasure to take care of you today. Thank you for choosing our clinic. I hope that you start to feel better quickly.   Honor Loh, MSN, APRN, FNP-C, CEN Advanced Practice Provider [facility]

## 2019-05-24 ENCOUNTER — Other Ambulatory Visit: Payer: Self-pay | Admitting: Physician Assistant

## 2019-05-24 DIAGNOSIS — K1121 Acute sialoadenitis: Secondary | ICD-10-CM

## 2019-05-31 ENCOUNTER — Ambulatory Visit: Payer: Medicare Other

## 2019-06-03 ENCOUNTER — Ambulatory Visit: Admission: RE | Admit: 2019-06-03 | Payer: Medicare Other | Source: Ambulatory Visit

## 2019-10-22 DIAGNOSIS — Z8673 Personal history of transient ischemic attack (TIA), and cerebral infarction without residual deficits: Secondary | ICD-10-CM | POA: Diagnosis not present

## 2019-11-12 DIAGNOSIS — I10 Essential (primary) hypertension: Secondary | ICD-10-CM | POA: Diagnosis not present

## 2019-11-12 DIAGNOSIS — Z8673 Personal history of transient ischemic attack (TIA), and cerebral infarction without residual deficits: Secondary | ICD-10-CM | POA: Diagnosis not present

## 2019-11-12 DIAGNOSIS — E785 Hyperlipidemia, unspecified: Secondary | ICD-10-CM | POA: Diagnosis not present

## 2019-11-12 DIAGNOSIS — Z87891 Personal history of nicotine dependence: Secondary | ICD-10-CM | POA: Diagnosis not present

## 2019-11-12 DIAGNOSIS — G4700 Insomnia, unspecified: Secondary | ICD-10-CM | POA: Diagnosis not present

## 2019-11-12 DIAGNOSIS — Z0000 Encounter for general adult medical examination without abnormal findings: Secondary | ICD-10-CM | POA: Diagnosis not present

## 2019-11-18 DIAGNOSIS — E785 Hyperlipidemia, unspecified: Secondary | ICD-10-CM | POA: Diagnosis not present

## 2020-02-08 ENCOUNTER — Other Ambulatory Visit: Payer: Self-pay

## 2020-02-08 ENCOUNTER — Ambulatory Visit
Admission: EM | Admit: 2020-02-08 | Discharge: 2020-02-08 | Disposition: A | Discharge status: Home / Self Care | Payer: Medicare HMO | Attending: Internal Medicine | Admitting: Internal Medicine

## 2020-02-08 DIAGNOSIS — Z8673 Personal history of transient ischemic attack (TIA), and cerebral infarction without residual deficits: Secondary | ICD-10-CM | POA: Diagnosis not present

## 2020-02-08 DIAGNOSIS — Z79899 Other long term (current) drug therapy: Secondary | ICD-10-CM | POA: Diagnosis not present

## 2020-02-08 DIAGNOSIS — E7800 Pure hypercholesterolemia, unspecified: Secondary | ICD-10-CM | POA: Diagnosis not present

## 2020-02-08 DIAGNOSIS — Z7982 Long term (current) use of aspirin: Secondary | ICD-10-CM | POA: Insufficient documentation

## 2020-02-08 DIAGNOSIS — Z87891 Personal history of nicotine dependence: Secondary | ICD-10-CM | POA: Insufficient documentation

## 2020-02-08 DIAGNOSIS — F418 Other specified anxiety disorders: Secondary | ICD-10-CM

## 2020-02-08 HISTORY — DX: Pure hypercholesterolemia, unspecified: E78.00

## 2020-02-08 LAB — CBC WITH DIFFERENTIAL/PLATELET
Abs Immature Granulocytes: 0.03 10*3/uL (ref 0.00–0.07)
Basophils Absolute: 0.1 10*3/uL (ref 0.0–0.1)
Basophils Relative: 1 %
Eosinophils Absolute: 0.2 10*3/uL (ref 0.0–0.5)
Eosinophils Relative: 2 %
HCT: 38 % (ref 36.0–46.0)
Hemoglobin: 12.2 g/dL (ref 12.0–15.0)
Immature Granulocytes: 0 %
Lymphocytes Relative: 22 %
Lymphs Abs: 2.6 10*3/uL (ref 0.7–4.0)
MCH: 30 pg (ref 26.0–34.0)
MCHC: 32.1 g/dL (ref 30.0–36.0)
MCV: 93.4 fL (ref 80.0–100.0)
Monocytes Absolute: 0.9 10*3/uL (ref 0.1–1.0)
Monocytes Relative: 8 %
Neutro Abs: 7.9 10*3/uL — ABNORMAL HIGH (ref 1.7–7.7)
Neutrophils Relative %: 67 %
Platelets: 332 10*3/uL (ref 150–400)
RBC: 4.07 MIL/uL (ref 3.87–5.11)
RDW: 13.8 % (ref 11.5–15.5)
WBC: 11.7 10*3/uL — ABNORMAL HIGH (ref 4.0–10.5)
nRBC: 0 % (ref 0.0–0.2)

## 2020-02-08 LAB — COMPREHENSIVE METABOLIC PANEL
ALT: 20 U/L (ref 0–44)
AST: 19 U/L (ref 15–41)
Albumin: 3.9 g/dL (ref 3.5–5.0)
Alkaline Phosphatase: 95 U/L (ref 38–126)
Anion gap: 9 (ref 5–15)
BUN: 22 mg/dL (ref 8–23)
CO2: 26 mmol/L (ref 22–32)
Calcium: 8.6 mg/dL — ABNORMAL LOW (ref 8.9–10.3)
Chloride: 103 mmol/L (ref 98–111)
Creatinine, Ser: 1 mg/dL (ref 0.44–1.00)
GFR calc Af Amer: >60 mL/min (ref >60)
GFR calc non Af Amer: 58 mL/min — ABNORMAL LOW (ref >60)
Glucose, Bld: 109 mg/dL — ABNORMAL HIGH (ref 70–99)
Potassium: 4.4 mmol/L (ref 3.5–5.1)
Sodium: 138 mmol/L (ref 135–145)
Total Bilirubin: 0.5 mg/dL (ref 0.3–1.2)
Total Protein: 7.5 g/dL (ref 6.5–8.1)

## 2020-02-08 LAB — TSH: TSH: 1.995 u[IU]/mL (ref 0.350–4.500)

## 2020-02-08 MED ORDER — HYDROXYZINE HCL 25 MG PO TABS: 25.0000 mg | ORAL_TABLET | Freq: Two times a day (BID) | ORAL | 0 refills | Status: DC | PRN

## 2020-02-08 NOTE — ED Provider Notes (Signed)
[facility]    CSN: [id number] Arrival date & time: 02/08/20  1649      History   Chief Complaint Chief Complaint  Patient presents with  . Panic Attack    HPI Shelly Mitchell is a 69 y.o. female comes to the urgent care with complaints of increased sense of anxiety over the past couple of weeks.  Patient says symptoms have been persisting.  She describes it as acute onset shortness of breath with increased anxiety.  It is associated with some numbness at the fingertips.  There is no precipitating events.  No chest pain or chest pressure.  No known relieving factors.  No nausea or vomiting.  No diarrhea.  Patient is not homicidal or suicidal.  HPI  Past Medical History:  Diagnosis Date  . Hypercholesterolemia   . Stroke (HCC)   . Tobacco abuse     Patient Active Problem List   Diagnosis Date Noted  . CVA (cerebral vascular accident) (HCC) 07/27/2018    Past Surgical History:  Procedure Laterality Date  . CESAREAN SECTION    . CHOLECYSTECTOMY      OB History   No obstetric history on file.      Home Medications    Prior to Admission medications   Medication Sig Start Date End Date Taking? Authorizing Provider  aspirin EC 81 MG tablet Take 1 tablet (81 mg total) by mouth daily. 07/28/18  Yes Sudini, Wardell Heath, MD  rosuvastatin (CRESTOR) 10 MG tablet Take 10 mg by mouth daily.   Yes [provider]  atorvastatin (LIPITOR) 40 MG tablet Take 1 tablet (40 mg total) by mouth daily at 6 PM. 07/28/18 02/08/20 Yes Sudini, Wardell Heath, MD  hydrOXYzine (ATARAX/VISTARIL) 25 MG tablet Take 1 tablet (25 mg total) by mouth 2 (two) times daily as needed for anxiety. 02/08/20   Jadarious Dobbins, Britta Mccreedy, MD  lisinopril (PRINIVIL,ZESTRIL) 5 MG tablet Take 1 tablet (5 mg total) by mouth daily. 07/28/18 09/10/18  Milagros Loll, MD  traZODone (DESYREL) 50 MG tablet Take 1-2 tablets (50-100 mg total) by mouth at bedtime. 09/10/18 02/08/20  Tommie Sams, DO    Family  History Family History  Problem Relation Age of Onset  . CVA Mother     Social History Social History   Tobacco Use  . Smoking status: Former Smoker    Quit date: 07/27/2018    Years since quitting: 1.5  . Smokeless tobacco: Never Used  Vaping Use  . Vaping Use: Never used  Substance Use Topics  . Alcohol use: No  . Drug use: No     Allergies   Patient has no known allergies.   Review of Systems Review of Systems  Constitutional: Negative.   HENT: Negative.   Psychiatric/Behavioral: Positive for sleep disturbance. Negative for agitation, confusion, dysphoric mood, hallucinations, self-injury and suicidal ideas. The patient is nervous/anxious.      Physical Exam Triage Vital Signs ED Triage Vitals  Enc Vitals Group     BP 02/08/20 1722 (!) 147/77     Pulse Rate 02/08/20 1722 73     Resp 02/08/20 1722 18     Temp 02/08/20 1722 98.1 F (36.7 C)     Temp Source 02/08/20 1722 Oral     SpO2 02/08/20 1722 95 %     Weight --      Height --      Head Circumference --      Peak Flow --      Pain Score  02/08/20 1718 0     Pain Loc --      Pain Edu? --      Excl. in [place]? --    No data found.  Updated Vital Signs BP (!) 147/77 (BP Location: Right Arm)   Pulse 73   Temp 98.1 F (36.7 C) (Oral)   Resp 18   SpO2 95%   Visual Acuity Right Eye Distance:   Left Eye Distance:   Bilateral Distance:    Right Eye Near:   Left Eye Near:    Bilateral Near:     Physical Exam   UC Treatments / Results  Labs (all labs ordered are listed, but only abnormal results are displayed) Labs Reviewed  CBC WITH DIFFERENTIAL/PLATELET - Abnormal; Notable for the following components:      Result Value   WBC 11.7 (*)    Neutro Abs 7.9 (*)    All other components within normal limits  COMPREHENSIVE METABOLIC PANEL - Abnormal; Notable for the following components:   Glucose, Bld 109 (*)    Calcium 8.6 (*)    GFR calc non Af Amer 58 (*)    All other components within normal  limits  TSH    EKG   Radiology No results found.  Procedures Procedures (including critical care time)  Medications Ordered in UC Medications - No data to display  Initial Impression / Assessment and Plan / UC Course  I have reviewed the triage vital signs and the nursing notes.  Pertinent labs & imaging results that were available during my care of the patient were reviewed by me and considered in my medical decision making (see chart for details).     1.  Anxiety with limited symptom attacks: CBC, BMP, TSH Hydroxyzine 25 mg every 8 hours as needed for anxiety Patient is not suicidal or homicidal Information about managing anxiety is given to patient. If patient symptoms are persistent then she may need primary care follow-up with psychiatry follow-up. Return precautions given. Final Clinical Impressions(s) / UC Diagnoses   Final diagnoses:  Anxiety with limited-symptom attacks   Discharge Instructions   None    ED Prescriptions    Medication Sig Dispense Auth. Provider   hydrOXYzine (ATARAX/VISTARIL) 25 MG tablet Take 1 tablet (25 mg total) by mouth 2 (two) times daily as needed for anxiety. 20 tablet Lakara Weiland, Myrene Galas, MD     PDMP not reviewed this encounter.   Chase Picket, MD 02/08/20 [phone]

## 2020-02-08 NOTE — ED Triage Notes (Signed)
Pt presents with feeling of nervousness since Sunday at 0500.  C/o restlessness and insomnia since then.  States her mother is in a nursing home and she receives calls at home daily.  Denies CP, SOB.  Has never been dx with anxiety but was on Trazodone for sleep last year.

## 2020-03-01 ENCOUNTER — Ambulatory Visit
Admission: EM | Admit: 2020-03-01 | Discharge: 2020-03-01 | Disposition: A | Discharge status: Home / Self Care | Payer: Medicare HMO | Attending: Nurse Practitioner | Admitting: Nurse Practitioner

## 2020-03-01 ENCOUNTER — Other Ambulatory Visit: Payer: Self-pay

## 2020-03-01 DIAGNOSIS — F419 Anxiety disorder, unspecified: Secondary | ICD-10-CM

## 2020-03-01 DIAGNOSIS — Z760 Encounter for issue of repeat prescription: Secondary | ICD-10-CM | POA: Diagnosis not present

## 2020-03-01 MED ORDER — HYDROXYZINE HCL 25 MG PO TABS: 25.0000 mg | ORAL_TABLET | Freq: Two times a day (BID) | ORAL | 0 refills | Status: AC | PRN

## 2020-03-01 NOTE — ED Provider Notes (Signed)
[facility]    CSN: [id number] Arrival date & time: 03/01/20  0816      History   Chief Complaint Chief Complaint  Patient presents with   Medication Refill   Anxiety    HPI Shelly Mitchell is a 69 y.o. female.   Subjective:   Shelly Mitchell is a 69 y.o. female who presents for follow up of anxiety disorder. She reports difficulty concentrating and recent stressors. Patient states that she is having problems trying to get medicaid for her mother who is in a nursing home.  She has to deal with multiple phone conversations about this and not getting any resolution.  She denies current suicidal and homicidal ideation. Family history negative for any known psychiatric illness. She was seen on 02/08/2020 here for similar complaints.  She was prescribed 20 tablets of Atarax.  Patient presents today for medication refill.  She reports that the medication has been helpful.  She denies any negative side effects from the medications. She has not been on any other antianxiety medications in the past.    The following portions of the patient's history were reviewed and updated as appropriate: allergies, current medications, past family history, past medical history, past social history, past surgical history and problem list.       Past Medical History:  Diagnosis Date   Hypercholesterolemia    Stroke [facility])    Tobacco abuse     Patient Active Problem List   Diagnosis Date Noted   CVA (cerebral vascular accident) (HCC) 07/27/2018    Past Surgical History:  Procedure Laterality Date   CESAREAN SECTION     CHOLECYSTECTOMY      OB History   No obstetric history on file.      Home Medications    Prior to Admission medications   Medication Sig Start Date End Date Taking? Authorizing Provider  aspirin EC 81 MG tablet Take 1 tablet (81 mg total) by mouth daily. 07/28/18  Yes Sudini, Wardell Heath, MD  lisinopril (PRINIVIL,ZESTRIL) 5 MG tablet Take 1 tablet (5  mg total) by mouth daily. 07/28/18 03/01/20 Yes Sudini, Wardell Heath, MD  rosuvastatin (CRESTOR) 10 MG tablet Take 10 mg by mouth daily.   Yes [provider]  hydrOXYzine (ATARAX/VISTARIL) 25 MG tablet Take 1 tablet (25 mg total) by mouth 2 (two) times daily as needed for up to 5 days for anxiety. 03/01/20 03/06/20  Lurline Idol, FNP  atorvastatin (LIPITOR) 40 MG tablet Take 1 tablet (40 mg total) by mouth daily at 6 PM. 07/28/18 02/08/20  Milagros Loll, MD  traZODone (DESYREL) 50 MG tablet Take 1-2 tablets (50-100 mg total) by mouth at bedtime. 09/10/18 02/08/20  Tommie Sams, DO    Family History Family History  Problem Relation Age of Onset   CVA Mother     Social History Social History   Tobacco Use   Smoking status: Former Smoker    Quit date: 07/27/2018    Years since quitting: 1.5   Smokeless tobacco: Never Used  Vaping Use   Vaping Use: Never used  Substance Use Topics   Alcohol use: No   Drug use: No     Allergies   Patient has no known allergies.   Review of Systems Review of Systems  Respiratory: Negative.   Cardiovascular: Negative.   Gastrointestinal: Negative.   Neurological: Negative.   Psychiatric/Behavioral: Positive for decreased concentration. Negative for confusion, hallucinations, self-injury and suicidal ideas. The patient is nervous/anxious.   All other systems  reviewed and are negative.    Physical Exam Triage Vital Signs ED Triage Vitals  Enc Vitals Group     BP 03/01/20 0841 (!) 136/100     Pulse Rate 03/01/20 0841 72     Resp 03/01/20 0841 18     Temp 03/01/20 0841 97.8 F (36.6 C)     Temp Source 03/01/20 0841 Oral     SpO2 03/01/20 0841 100 %     Weight 03/01/20 0838 185 lb (83.9 kg)     Height 03/01/20 0838 5\' 2"  (1.575 m)     Head Circumference --      Peak Flow --      Pain Score 03/01/20 0838 0     Pain Loc --      Pain Edu? --      Excl. in [place]? --    No data found.  Updated Vital Signs BP (!) 136/100 (BP  Location: Left Arm)    Pulse 72    Temp 97.8 F (36.6 C) (Oral)    Resp 18    Ht 5\' 2"  (1.575 m)    Wt 185 lb (83.9 kg)    SpO2 100%    BMI 33.84 kg/m   Visual Acuity Right Eye Distance:   Left Eye Distance:   Bilateral Distance:    Right Eye Near:   Left Eye Near:    Bilateral Near:     Physical Exam Vitals reviewed.  Constitutional:      Appearance: Normal appearance.  HENT:     Head: Normocephalic.  Cardiovascular:     Rate and Rhythm: Normal rate and regular rhythm.  Pulmonary:     Effort: Pulmonary effort is normal.     Breath sounds: Normal breath sounds.  Musculoskeletal:        General: Normal range of motion.     Cervical back: Normal range of motion and neck supple.  Skin:    General: Skin is warm and dry.  Neurological:     General: No focal deficit present.     Mental Status: She is alert and oriented to person, place, and time.  Psychiatric:        Attention and Perception: Attention normal.        Mood and Affect: Mood and affect normal.        Speech: Speech normal.        Behavior: Behavior normal. Behavior is cooperative.        Cognition and Memory: Cognition normal.        Judgment: Judgment normal.      UC Treatments / Results  Labs (all labs ordered are listed, but only abnormal results are displayed) Labs Reviewed - No data to display  EKG   Radiology No results found.  Procedures Procedures (including critical care time)  Medications Ordered in UC Medications - No data to display  Initial Impression / Assessment and Plan / UC Course  I have reviewed the triage vital signs and the nursing notes.  Pertinent labs & imaging results that were available during my care of the patient were reviewed by me and considered in my medical decision making (see chart for details).    68 yo female with history of anxiety presenting for medication refill of her atarax. Medication was prescribed here last month. She has not been on any other  antianxiety medications in the past. She currently denies any SI/HI. Informed patient that we will only provide 5 days of this medication as  she should follow-up with her primary care provider for management of her anxiety and medications.Handouts describing disease, natural history, and treatment were given to the patient. Instructed patient to contact PCP promptly should condition worsen or any new symptoms appear and provided on-call telephone numbers. IF THE PATIENT HAS ANY SUICIDAL OR HOMICIDAL IDEATIONS, GO TO THE ER IMMEDIATELY. Patient was agreeable with this plan.  Today's evaluation has revealed no signs of a dangerous process. Discussed diagnosis with patient and/or guardian. Patient and/or guardian aware of their diagnosis, possible red flag symptoms to watch out for and need for close follow up. Patient and/or guardian understands verbal and written discharge instructions. Patient and/or guardian comfortable with plan and disposition.  Patient and/or guardian has a clear mental status at this time, good insight into illness (after discussion and teaching) and has clear judgment to make decisions regarding their care  This care was provided during an unprecedented National Emergency due to the Novel Coronavirus (COVID-19) pandemic. COVID-19 infections and transmission risks place heavy strains on healthcare resources.  As this pandemic evolves, our facility, providers, and staff strive to respond fluidly, to remain operational, and to provide care relative to available resources and information. Outcomes are unpredictable and treatments are without well-defined guidelines. Further, the impact of COVID-19 on all aspects of urgent care, including the impact to patients seeking care for reasons other than COVID-19, is unavoidable during this national emergency. At this time of the global pandemic, management of patients has significantly changed, even for non-COVID positive patients given high local and  regional COVID volumes at this time requiring high healthcare system and resource utilization. The standard of care for management of both COVID suspected and non-COVID suspected patients continues to change rapidly at the local, regional, national, and global levels. This patient was worked up and treated to the best available but ever changing evidence and resources available at this current time.   Documentation was completed with the aid of voice recognition software. Transcription may contain typographical errors.   Final Clinical Impressions(s) / UC Diagnoses   Final diagnoses:  Anxiety  Encounter for medication refill     Discharge Instructions     You need to follow-up with your primary care doctor regarding your anxiety and medications for this.  I will provide you with 5 days worth of your medications; hopefully, this will allow you to see your doctor before you run out.    ED Prescriptions    Medication Sig Dispense Auth. Provider   hydrOXYzine (ATARAX/VISTARIL) 25 MG tablet Take 1 tablet (25 mg total) by mouth 2 (two) times daily as needed for up to 5 days for anxiety. 10 tablet Lurline Idol, FNP     PDMP not reviewed this encounter.   Cathlean Sauer [place], [place] 03/01/20 [phone]

## 2020-03-01 NOTE — ED Triage Notes (Signed)
Patient states that she was seen last month for panic attacks and is out of the Hydroxyzine that she was given. Patient states that this has helped her and she would like a refill. Advised patient that most of the time we like for patients to follow up with PCP for refills.

## 2020-03-01 NOTE — Discharge Instructions (Addendum)
You need to follow-up with your primary care doctor regarding your anxiety and medications for this.  I will provide you with 5 days worth of your medications; hopefully, this will allow you to see your doctor before you run out.

## 2020-07-19 DIAGNOSIS — H524 Presbyopia: Secondary | ICD-10-CM | POA: Diagnosis not present

## 2020-09-18 ENCOUNTER — Other Ambulatory Visit: Payer: Self-pay

## 2020-09-18 ENCOUNTER — Ambulatory Visit
Admission: EM | Admit: 2020-09-18 | Discharge: 2020-09-18 | Disposition: A | Discharge status: Home / Self Care | Payer: Medicare HMO | Attending: Emergency Medicine | Admitting: Emergency Medicine

## 2020-09-18 DIAGNOSIS — M5432 Sciatica, left side: Secondary | ICD-10-CM | POA: Diagnosis not present

## 2020-09-18 MED ORDER — PREDNISONE 10 MG (21) PO TBPK: ORAL_TABLET | Freq: Every day | ORAL | 0 refills | Status: DC

## 2020-09-18 NOTE — ED Provider Notes (Signed)
[facility]    CSN: [id number] Arrival date & time: 09/18/20  7353      History   Chief Complaint Chief Complaint  Patient presents with  . Back Pain    HPI Shelly Mitchell is a 70 y.o. female.   HPI   70 year old female here for evaluation of left low back pain that radiates into her left leg.  Patient reports that she has had symptoms for the past week.  Patient denies any heavy lifting, falls, or other injuries to her low back.  Patient has been taking over-the-counter ibuprofen with little relief.  Past Medical History:  Diagnosis Date  . Hypercholesterolemia   . Stroke (HCC)   . Tobacco abuse     Patient Active Problem List   Diagnosis Date Noted  . CVA (cerebral vascular accident) (HCC) 07/27/2018    Past Surgical History:  Procedure Laterality Date  . CESAREAN SECTION    . CHOLECYSTECTOMY      OB History   No obstetric history on file.      Home Medications    Prior to Admission medications   Medication Sig Start Date End Date Taking? Authorizing Provider  aspirin EC 81 MG tablet Take 1 tablet (81 mg total) by mouth daily. 07/28/18  Yes Sudini, Wardell Heath, MD  lisinopril (PRINIVIL,ZESTRIL) 5 MG tablet Take 1 tablet (5 mg total) by mouth daily. 07/28/18 03/01/20 Yes Sudini, Srikar, MD  predniSONE (STERAPRED UNI-PAK 21 TAB) 10 MG (21) TBPK tablet Take by mouth daily. Take 6 tabs by mouth daily  for 2 days, then 5 tabs for 2 days, then 4 tabs for 2 days, then 3 tabs for 2 days, 2 tabs for 2 days, then 1 tab by mouth daily for 2 days 09/18/20  Yes Becky Augusta, NP  rosuvastatin (CRESTOR) 10 MG tablet Take 10 mg by mouth daily.   Yes [provider]  atorvastatin (LIPITOR) 40 MG tablet Take 1 tablet (40 mg total) by mouth daily at 6 PM. 07/28/18 02/08/20  Milagros Loll, MD  traZODone (DESYREL) 50 MG tablet Take 1-2 tablets (50-100 mg total) by mouth at bedtime. 09/10/18 02/08/20  Tommie Sams, DO    Family History Family History  Problem  Relation Age of Onset  . CVA Mother     Social History Social History   Tobacco Use  . Smoking status: Former Smoker    Quit date: 07/27/2018    Years since quitting: 2.1  . Smokeless tobacco: Never Used  Vaping Use  . Vaping Use: Never used  Substance Use Topics  . Alcohol use: No  . Drug use: No     Allergies   Patient has no known allergies.   Review of Systems Review of Systems  Constitutional: Negative for activity change and appetite change.  Musculoskeletal: Positive for back pain. Negative for arthralgias and myalgias.  Skin: Negative for rash.  Neurological: Negative for weakness and numbness.  Psychiatric/Behavioral: Negative.      Physical Exam Triage Vital Signs ED Triage Vitals  Enc Vitals Group     BP      Pulse      Resp      Temp      Temp src      SpO2      Weight      Height      Head Circumference      Peak Flow      Pain Score      Pain Loc  Pain Edu?      Excl. in [place]?    No data found.  Updated Vital Signs BP (!) 150/72 (BP Location: Right Arm)   Pulse 69   Temp 98.6 F (37 C) (Oral)   Resp 18   Ht 5\' 2"  (1.575 m)   Wt 180 lb (81.6 kg)   SpO2 98%   BMI 32.92 kg/m   Visual Acuity Right Eye Distance:   Left Eye Distance:   Bilateral Distance:    Right Eye Near:   Left Eye Near:    Bilateral Near:     Physical Exam Vitals and nursing note reviewed.  Constitutional:      General: She is not in acute distress.    Appearance: Normal appearance. She is obese. She is not toxic-appearing.  HENT:     Head: Normocephalic and atraumatic.  Cardiovascular:     Rate and Rhythm: Normal rate and regular rhythm.     Pulses: Normal pulses.     Heart sounds: Normal heart sounds. No murmur heard. No gallop.   Pulmonary:     Effort: Pulmonary effort is normal.     Breath sounds: Normal breath sounds. No wheezing or rales.  Musculoskeletal:        General: Tenderness present. No swelling, deformity or signs of injury. Normal  range of motion.  Skin:    General: Skin is warm and dry.     Capillary Refill: Capillary refill takes less than 2 seconds.  Neurological:     General: No focal deficit present.     Mental Status: She is alert and oriented to person, place, and time.     Motor: No weakness.     Deep Tendon Reflexes: Reflexes normal.  Psychiatric:        Mood and Affect: Mood normal.        Behavior: Behavior normal.        Thought Content: Thought content normal.      UC Treatments / Results  Labs (all labs ordered are listed, but only abnormal results are displayed) Labs Reviewed - No data to display  EKG   Radiology No results found.  Procedures Procedures (including critical care time)  Medications Ordered in UC Medications - No data to display  Initial Impression / Assessment and Plan / UC Course  I have reviewed the triage vital signs and the nursing notes.  Pertinent labs & imaging results that were available during my care of the patient were reviewed by me and considered in my medical decision making (see chart for details).   Patient is a very pleasant 70 year old female here for evaluation of the left low back pain that radiates through her left buttock and down the outside of her left leg.  Pain has been there about a week.  Patient does have tenderness when palpating along the sciatic nerve through her left buttock and down the outside of her left leg to the level of her knee.  Patient does have a positive straight leg raise on the left.  Right straight leg raise is negative.  Patient symptoms are consistent with sciatica.  Will treat with a prednisone pack and give home PT exercises.   Final Clinical Impressions(s) / UC Diagnoses   Final diagnoses:  Sciatica of left side     Discharge Instructions     Take the prednisone according to the package instructions.  Go ahead and get that started this morning after you eat a little something.  Going forward  you should take it  every morning at breakfast.  Follow the exercises given at your discharge paperwork to help relieve your symptoms.  Losing a little bit of weight, increasing her flexibility, and improving her core strength can help with your symptoms.    ED Prescriptions    Medication Sig Dispense Auth. Provider   predniSONE (STERAPRED UNI-PAK 21 TAB) 10 MG (21) TBPK tablet Take by mouth daily. Take 6 tabs by mouth daily  for 2 days, then 5 tabs for 2 days, then 4 tabs for 2 days, then 3 tabs for 2 days, 2 tabs for 2 days, then 1 tab by mouth daily for 2 days 42 tablet Becky Augusta, NP     PDMP not reviewed this encounter.   Becky Augusta, NP 09/18/20 [phone]

## 2020-09-18 NOTE — ED Triage Notes (Signed)
Pt presents with c/o pain to her left lower back and radiates down her leg. Pt states pain has been present x1 week. Pt has taken Ibuprofen with mild relief. Pt has also tried a heating pad and that did not help. Pt has had similar episodes that would resolve on their own. Pt denies any known back injuries. Pt states pain increases after sitting a long time, no reported decreased ROM otherwise.

## 2020-09-18 NOTE — Discharge Instructions (Signed)
Take the prednisone according to the package instructions.  Go ahead and get that started this morning after you eat a little something.  Going forward you should take it every morning at breakfast.  Follow the exercises given at your discharge paperwork to help relieve your symptoms.  Losing a little bit of weight, increasing her flexibility, and improving her core strength can help with your symptoms.

## 2020-10-05 ENCOUNTER — Other Ambulatory Visit: Payer: Self-pay

## 2020-10-05 ENCOUNTER — Ambulatory Visit
Admission: EM | Admit: 2020-10-05 | Discharge: 2020-10-05 | Disposition: A | Discharge status: Home / Self Care | Payer: Medicare HMO | Attending: Physician Assistant | Admitting: Physician Assistant

## 2020-10-05 ENCOUNTER — Encounter: Payer: Self-pay | Admitting: Emergency Medicine

## 2020-10-05 DIAGNOSIS — R519 Headache, unspecified: Secondary | ICD-10-CM | POA: Insufficient documentation

## 2020-10-05 DIAGNOSIS — R42 Dizziness and giddiness: Secondary | ICD-10-CM | POA: Diagnosis not present

## 2020-10-05 DIAGNOSIS — I10 Essential (primary) hypertension: Secondary | ICD-10-CM | POA: Diagnosis not present

## 2020-10-05 LAB — URINALYSIS, COMPLETE (UACMP) WITH MICROSCOPIC
Bilirubin Urine: NEGATIVE
Glucose, UA: NEGATIVE mg/dL
Hgb urine dipstick: NEGATIVE
Ketones, ur: NEGATIVE mg/dL
Leukocytes,Ua: NEGATIVE
Nitrite: NEGATIVE
Protein, ur: NEGATIVE mg/dL
RBC / HPF: NONE SEEN RBC/hpf (ref 0–5)
Specific Gravity, Urine: 1.025 (ref 1.005–1.030)
pH: 5 (ref 5.0–8.0)

## 2020-10-05 MED ORDER — MECLIZINE HCL 25 MG PO TABS: 25.0000 mg | ORAL_TABLET | Freq: Three times a day (TID) | ORAL | 0 refills | Status: AC | PRN

## 2020-10-05 NOTE — ED Provider Notes (Signed)
[facility]    CSN: [id number] Arrival date & time: 10/05/20  1250      History   Chief Complaint Chief Complaint  Patient presents with  . Hypertension    HPI Shelly Mitchell is a 70 y.o. female.   Presenting for concern about elevated blood pressures over the past 4 to 5 days.  Patient states that she has been checking her blood pressure with her at home monitor and has readings in the 150s systolic.  Patient states that she normally has controlled blood pressure with lisinopril 5 mg.  She did recently take prednisone, a 12-day course, but has been off that medication for a couple of days.  Patient also admits to intermittent headaches and feeling slightly dizzy at times.  She says she is now only having a headache or feeling dizzy right now but she did yesterday.  Patient denies severe headache. Denies ear pain, congestions, sore throat or cough. Does admit to ringing in right ear at times. She denies ever having any vision changes, facial drooping, syncope/presyncope, falls or head injury, speech or balance problems, numbness/weakness/tingling, nausea or vomiting.  Denies chest pain or shortness of breath.  No palpitations or leg swelling.  Patient denies any abdominal pain, urinary frequency/urgency or painful urination.  Past medical history significant for hypertension, hyperlipidemia, and stroke in 2019.  Former smoker.  Has quit smoking 2 years ago.  Patient denies any Covid exposure.  Fully vaccinated for COVID-19.  Patient has no other complaints or concerns today.  HPI  Past Medical History:  Diagnosis Date  . Hypercholesterolemia   . Stroke (HCC)   . Tobacco abuse     Patient Active Problem List   Diagnosis Date Noted  . CVA (cerebral vascular accident) (HCC) 07/27/2018    Past Surgical History:  Procedure Laterality Date  . CESAREAN SECTION    . CHOLECYSTECTOMY      OB History   No obstetric history on file.      Home Medications    Prior to  Admission medications   Medication Sig Start Date End Date Taking? Authorizing Provider  aspirin EC 81 MG tablet Take 1 tablet (81 mg total) by mouth daily. 07/28/18  Yes Sudini, Wardell Heath, MD  lisinopril (PRINIVIL,ZESTRIL) 5 MG tablet Take 1 tablet (5 mg total) by mouth daily. 07/28/18 03/01/20 Yes Sudini, Wardell Heath, MD  lisinopril (ZESTRIL) 5 MG tablet Take by mouth. 09/12/20  Yes [provider]  meclizine (ANTIVERT) 25 MG tablet Take 1 tablet (25 mg total) by mouth 3 (three) times daily as needed for up to 5 days for dizziness. 10/05/20 10/10/20 Yes Eusebio Friendly B, PA-C  rosuvastatin (CRESTOR) 20 MG tablet Take by mouth. 07/04/20  Yes [provider]  predniSONE (STERAPRED UNI-PAK 21 TAB) 10 MG (21) TBPK tablet Take by mouth daily. Take 6 tabs by mouth daily  for 2 days, then 5 tabs for 2 days, then 4 tabs for 2 days, then 3 tabs for 2 days, 2 tabs for 2 days, then 1 tab by mouth daily for 2 days 09/18/20   Becky Augusta, NP  rosuvastatin (CRESTOR) 10 MG tablet Take 10 mg by mouth daily.    [provider]  atorvastatin (LIPITOR) 40 MG tablet Take 1 tablet (40 mg total) by mouth daily at 6 PM. 07/28/18 02/08/20  Milagros Loll, MD  traZODone (DESYREL) 50 MG tablet Take 1-2 tablets (50-100 mg total) by mouth at bedtime. 09/10/18 02/08/20  Tommie Sams, DO  Family History Family History  Problem Relation Age of Onset  . CVA Mother     Social History Social History   Tobacco Use  . Smoking status: Former Smoker    Quit date: 07/27/2018    Years since quitting: 2.1  . Smokeless tobacco: Never Used  Vaping Use  . Vaping Use: Never used  Substance Use Topics  . Alcohol use: No  . Drug use: No     Allergies   Patient has no known allergies.   Review of Systems Review of Systems  Constitutional: Negative for chills, diaphoresis, fatigue and fever.  HENT: Negative for congestion, ear pain, rhinorrhea, sinus pressure, sinus pain and sore throat.   Respiratory: Negative  for cough and shortness of breath.   Gastrointestinal: Negative for abdominal pain, nausea and vomiting.  Musculoskeletal: Negative for arthralgias and myalgias.  Skin: Negative for rash.  Neurological: Positive for dizziness and headaches. Negative for weakness.  Hematological: Negative for adenopathy.     Physical Exam Triage Vital Signs ED Triage Vitals  Enc Vitals Group     BP 10/05/20 1301 128/63     Pulse Rate 10/05/20 1301 78     Resp --      Temp 10/05/20 1301 98.7 F (37.1 C)     Temp Source 10/05/20 1301 Oral     SpO2 10/05/20 1301 98 %     Weight 10/05/20 1258 180 lb (81.6 kg)     Height 10/05/20 1258 5\' 2"  (1.575 m)     Head Circumference --      Peak Flow --      Pain Score 10/05/20 1258 8     Pain Loc --      Pain Edu? --      Excl. in GC? --    No data found.  Updated Vital Signs BP 128/63 (BP Location: Left Arm)   Pulse 78   Temp 98.7 F (37.1 C) (Oral)   Ht 5\' 2"  (1.575 m)   Wt 180 lb (81.6 kg)   SpO2 98%   BMI 32.92 kg/m       Physical Exam Vitals and nursing note reviewed.  Constitutional:      General: She is not in acute distress.    Appearance: Normal appearance. She is not ill-appearing or toxic-appearing.  HENT:     Head: Normocephalic and atraumatic.     Right Ear: Ear canal and external ear normal. A middle ear effusion is present. Tympanic membrane is scarred.     Left Ear: Ear canal and external ear normal. Tympanic membrane is scarred.     Nose: Nose normal.     Mouth/Throat:     Mouth: Mucous membranes are moist.     Pharynx: Oropharynx is clear.  Eyes:     General: No scleral icterus.       Right eye: No discharge.        Left eye: No discharge.     Extraocular Movements: Extraocular movements intact.     Conjunctiva/sclera: Conjunctivae normal.     Pupils: Pupils are equal, round, and reactive to light.  Cardiovascular:     Rate and Rhythm: Normal rate and regular rhythm.     Heart sounds: Normal heart sounds.   Pulmonary:     Effort: Pulmonary effort is normal. No respiratory distress.     Breath sounds: Normal breath sounds. No wheezing, rhonchi or rales.  Abdominal:     Palpations: Abdomen is soft.     Tenderness:  There is abdominal tenderness (mild suprapubic. Patient says that she needs to urinate).  Musculoskeletal:     Cervical back: Neck supple.  Skin:    General: Skin is dry.  Neurological:     General: No focal deficit present.     Mental Status: She is alert and oriented to person, place, and time. Mental status is at baseline.     Cranial Nerves: No cranial nerve deficit.     Sensory: No sensory deficit.     Motor: No weakness.     Gait: Gait normal.  Psychiatric:        Mood and Affect: Mood normal.        Behavior: Behavior normal.        Thought Content: Thought content normal.      UC Treatments / Results  Labs (all labs ordered are listed, but only abnormal results are displayed) Labs Reviewed  URINALYSIS, COMPLETE (UACMP) WITH MICROSCOPIC    EKG   Radiology No results found.  Procedures Procedures (including critical care time)  Medications Ordered in UC Medications - No data to display  Initial Impression / Assessment and Plan / UC Course  I have reviewed the triage vital signs and the nursing notes.  Pertinent labs & imaging results that were available during my care of the patient were reviewed by me and considered in my medical decision making (see chart for details).   70 year old female presenting for concerns about elevated blood pressures off and on for the past few days.  Admits to associated headaches and dizziness as well as ringing in her right ear.  In the clinic all vital signs are normal and stable.  Blood pressure is 128/63.  Exam significant for scarring of TMs and mild effusion of the right ear.  Her chest is clear to auscultation and heart regular rate and rhythm.  She does have mild suprapubic tenderness on exam but states that this  area is tender because she has to urinate.  Urinalysis obtained due to tenderness in this area to ensure no UTI and also check her fluid status.  I did also offer fingerstick glucose and lab work, but patient declines and says she will just get a urine check.  Did offer EKG, but patient not having any chest pain and says she will hold off on this as well.  Patient says that she needs to go pick up her grandson.  Advised her I will call her with the urine test results.  Encourage patient to keep a log of her blood pressures and to check her monitor against other monitors to ensure that it is accurate.  Advised to continue taking lisinopril and follow-up with her prescribing provider regarding any possible medication changes, but her pressure is normal in the clinic and she is not really complaining of any headache or dizziness at this time.  I did send meclizine for her to try if she has any recurrent dizziness.  Advised Tylenol for any headaches.  Patient advised to follow-up with her clinic as needed for any new or worsening symptoms.  ED precautions for headaches, blood pressure and dizziness are reviewed with patient.  UA normal.   Final Clinical Impressions(s) / UC Diagnoses   Final diagnoses:  Essential hypertension  Acute nonintractable headache, unspecified headache type  Dizziness     Discharge Instructions     Your blood pressure is normal in the clinic today.  Continue taking your lisinopril as directed.  Try to keep a log  your blood pressures a couple times a day.  Check your monitor against another monitor, preferably 1 from a medical clinic.  It was possible that it might not be accurate.  Make sure you are exercising, decreasing sodium in your diet, and eating healthy.  Follow-up with your PCP regarding your hypertension.  Take tylenol for headache.  Take Meclizine as needed for dizziness. Go to ED for any uncontrollable hypertension or any secondary symptoms of severe headache,  vision changes, numbness/tingling/weakness, confusion, speech problems or difficulty walking.  Go to ED for any chest pain or breathing difficulty,     ED Prescriptions    Medication Sig Dispense Auth. Provider   meclizine (ANTIVERT) 25 MG tablet Take 1 tablet (25 mg total) by mouth 3 (three) times daily as needed for up to 5 days for dizziness. 15 tablet Gareth Morgan     PDMP not reviewed this encounter.   Shirlee Latch, PA-C 10/05/20 [phone]

## 2020-10-05 NOTE — Discharge Instructions (Addendum)
Your blood pressure is normal in the clinic today.  Continue taking your lisinopril as directed.  Try to keep a log your blood pressures a couple times a day.  Check your monitor against another monitor, preferably 1 from a medical clinic.  It was possible that it might not be accurate.  Make sure you are exercising, decreasing sodium in your diet, and eating healthy.  Follow-up with your PCP regarding your hypertension.  Take tylenol for headache.  Take Meclizine as needed for dizziness. Go to ED for any uncontrollable hypertension or any secondary symptoms of severe headache, vision changes, numbness/tingling/weakness, confusion, speech problems or difficulty walking.  Go to ED for any chest pain or breathing difficulty,

## 2020-10-05 NOTE — ED Triage Notes (Signed)
Patient c/o of high blood pressure since Saturday. Patient states he had a HA, dizziness, declines any other sym.

## 2020-10-10 ENCOUNTER — Ambulatory Visit
Admission: EM | Admit: 2020-10-10 | Discharge: 2020-10-10 | Disposition: A | Discharge status: Home / Self Care | Payer: Medicare HMO | Attending: Physician Assistant | Admitting: Physician Assistant

## 2020-10-10 ENCOUNTER — Other Ambulatory Visit: Payer: Self-pay

## 2020-10-10 DIAGNOSIS — M62838 Other muscle spasm: Secondary | ICD-10-CM | POA: Diagnosis not present

## 2020-10-10 DIAGNOSIS — M542 Cervicalgia: Secondary | ICD-10-CM | POA: Diagnosis not present

## 2020-10-10 MED ORDER — DICLOFENAC SODIUM 50 MG PO TBEC: 50.0000 mg | DELAYED_RELEASE_TABLET | Freq: Two times a day (BID) | ORAL | 0 refills | Status: AC

## 2020-10-10 MED ORDER — BACLOFEN 10 MG PO TABS: 10.0000 mg | ORAL_TABLET | Freq: Three times a day (TID) | ORAL | 0 refills | Status: AC | PRN

## 2020-10-10 NOTE — ED Triage Notes (Signed)
Pt c/o waking with cramp in the right side of her neck. She reports pain with movement of her head and shoulder.

## 2020-10-10 NOTE — ED Provider Notes (Signed)
[facility]    CSN: [id number] Arrival date & time: 10/10/20  1250      History   Chief Complaint Chief Complaint  Patient presents with  . Neck Pain    Right side    HPI Shelly Mitchell is a 70 y.o. female presenting for right sided neck pain since this morning. She says she woke up with the pain.  She denies any injury.  Patient denies any strenuous activity or anything that could cause the neck pain yesterday.  Pain is constant and aching.  Pain is worse with rotating the neck and extending the neck.  Patient has a previous history of "pinched nerves in my neck."  However, she says she has not had problems with her neck and 10 to 15 years.  Patient denies any radiation of her neck pain to her back or upper extremities.  Denies any associated numbness, weakness or tingling.  She denies any fever, headaches or fatigue.  Has taken ibuprofen for pain without relief.  No other complaints or concerns at this time.  HPI  Past Medical History:  Diagnosis Date  . Hypercholesterolemia   . Stroke (HCC)   . Tobacco abuse     Patient Active Problem List   Diagnosis Date Noted  . CVA (cerebral vascular accident) (HCC) 07/27/2018    Past Surgical History:  Procedure Laterality Date  . CESAREAN SECTION    . CHOLECYSTECTOMY      OB History   No obstetric history on file.      Home Medications    Prior to Admission medications   Medication Sig Start Date End Date Taking? Authorizing Provider  aspirin EC 81 MG tablet Take 1 tablet (81 mg total) by mouth daily. 07/28/18  Yes Milagros Loll, MD  baclofen (LIORESAL) 10 MG tablet Take 1 tablet (10 mg total) by mouth 3 (three) times daily as needed for up to 5 days for muscle spasms. 10/10/20 10/15/20 Yes Shirlee Latch, PA-C  diclofenac (VOLTAREN) 50 MG EC tablet Take 1 tablet (50 mg total) by mouth 2 (two) times daily for 7 days. 10/10/20 10/17/20 Yes Eusebio Friendly B, PA-C  lisinopril (PRINIVIL,ZESTRIL) 5 MG tablet Take 1  tablet (5 mg total) by mouth daily. 07/28/18 03/01/20 Yes Sudini, Wardell Heath, MD  meclizine (ANTIVERT) 25 MG tablet Take 1 tablet (25 mg total) by mouth 3 (three) times daily as needed for up to 5 days for dizziness. 10/05/20 10/10/20 Yes Eusebio Friendly B, PA-C  rosuvastatin (CRESTOR) 20 MG tablet Take by mouth. 07/04/20  Yes [provider]  lisinopril (ZESTRIL) 5 MG tablet Take by mouth. 09/12/20   [provider]  predniSONE (STERAPRED UNI-PAK 21 TAB) 10 MG (21) TBPK tablet Take by mouth daily. Take 6 tabs by mouth daily  for 2 days, then 5 tabs for 2 days, then 4 tabs for 2 days, then 3 tabs for 2 days, 2 tabs for 2 days, then 1 tab by mouth daily for 2 days 09/18/20   Becky Augusta, NP  rosuvastatin (CRESTOR) 10 MG tablet Take 10 mg by mouth daily.    [provider]  atorvastatin (LIPITOR) 40 MG tablet Take 1 tablet (40 mg total) by mouth daily at 6 PM. 07/28/18 02/08/20  Milagros Loll, MD  traZODone (DESYREL) 50 MG tablet Take 1-2 tablets (50-100 mg total) by mouth at bedtime. 09/10/18 02/08/20  Tommie Sams, DO    Family History Family History  Problem Relation Age of Onset  . CVA  Mother     Social History Social History   Tobacco Use  . Smoking status: Former Smoker    Quit date: 07/27/2018    Years since quitting: 2.2  . Smokeless tobacco: Never Used  Vaping Use  . Vaping Use: Never used  Substance Use Topics  . Alcohol use: No  . Drug use: No     Allergies   Patient has no known allergies.   Review of Systems Review of Systems  Constitutional: Negative for fatigue and fever.  Musculoskeletal: Positive for neck pain and neck stiffness. Negative for arthralgias, back pain, gait problem and myalgias.  Skin: Negative for rash and wound.  Neurological: Negative for dizziness, weakness, numbness and headaches.     Physical Exam Triage Vital Signs ED Triage Vitals  Enc Vitals Group     BP 10/10/20 1302 (!) 159/67     Pulse Rate 10/10/20 1302 95      Resp 10/10/20 1302 18     Temp 10/10/20 1302 97.9 F (36.6 C)     Temp Source 10/10/20 1302 Oral     SpO2 10/10/20 1302 99 %     Weight 10/10/20 1300 180 lb (81.6 kg)     Height 10/10/20 1300 5\' 2"  (1.575 m)     Head Circumference --      Peak Flow --      Pain Score 10/10/20 1259 9     Pain Loc --      Pain Edu? --      Excl. in GC? --    No data found.  Updated Vital Signs BP 139/60 (BP Location: Left Arm)   Pulse 95   Temp 97.9 F (36.6 C) (Oral)   Resp 18   Ht 5\' 2"  (1.575 m)   Wt 180 lb (81.6 kg)   SpO2 99%   BMI 32.92 kg/m   Physical Exam Vitals and nursing note reviewed.  Constitutional:      General: She is not in acute distress.    Appearance: Normal appearance. She is not ill-appearing or toxic-appearing.  HENT:     Head: Normocephalic and atraumatic.     Nose: Nose normal.     Mouth/Throat:     Mouth: Mucous membranes are moist.     Pharynx: Oropharynx is clear.  Eyes:     General: No scleral icterus.       Right eye: No discharge.        Left eye: No discharge.     Conjunctiva/sclera: Conjunctivae normal.  Cardiovascular:     Rate and Rhythm: Normal rate and regular rhythm.     Heart sounds: Normal heart sounds.  Pulmonary:     Effort: Pulmonary effort is normal. No respiratory distress.     Breath sounds: Normal breath sounds.  Musculoskeletal:     Cervical back: Neck supple. Tenderness (diffuse TTP right trapezius muscles and diffusely throughout paracervical muscles on right) present. No swelling, signs of trauma or bony tenderness. Pain with movement present. Decreased range of motion.     Comments: Good strength in UEs  Skin:    General: Skin is dry.  Neurological:     General: No focal deficit present.     Mental Status: She is alert. Mental status is at baseline.     Motor: No weakness.     Gait: Gait normal.  Psychiatric:        Mood and Affect: Mood normal.        Behavior: Behavior normal.  Thought Content: Thought content  normal.      UC Treatments / Results  Labs (all labs ordered are listed, but only abnormal results are displayed) Labs Reviewed - No data to display  EKG   Radiology No results found.  Procedures Procedures (including critical care time)  Medications Ordered in UC Medications - No data to display  Initial Impression / Assessment and Plan / UC Course  I have reviewed the triage vital signs and the nursing notes.  Pertinent labs & imaging results that were available during my care of the patient were reviewed by me and considered in my medical decision making (see chart for details).   70 year old female presenting for neck pain, spasms and stiffness since a couple hours ago.  Has tried ibuprofen without improvement in symptoms.  No other associated symptoms.  In the clinic vital signs are stable.  Blood pressure was initially elevated but did come down to 139/60.  She has tenderness throughout the right paracervical region and trapezius.  Range of motion limited in all directions due to pain and guarding.  Movement pain with rotation of neck to right and left as well as extension.  Good strength and sensation in upper extremities.  Suspect muscle spasms from possible cervical strain or sprain.  Treating this time with diclofenac, Tylenol and baclofen.  Encouraged use of heat and topical muscle rubs.  Advised her to follow-up with PCP if not improving over the next couple weeks.  ED precautions for neck pain reviewed patient.  Follow-up with our clinic as needed.   Final Clinical Impressions(s) / UC Diagnoses   Final diagnoses:  Muscle spasms of neck  Neck pain     Discharge Instructions     NECK PAIN: Stressed avoiding painful activities. This can exacerbate your symptoms and make them worse.  May apply heat to the areas of pain for some relief. Use medications as directed. Be aware of which medications make you drowsy and do not drive or operate any kind of heavy machinery  while using the medication (ie pain medications or muscle relaxers). F/U with PCP for reexamination or return sooner if condition worsens or does not begin to improve over the next few days.   NECK PAIN RED FLAGS: If symptoms get worse than they are right now, you should come back sooner for re-evaluation. If you have increased numbness/ tingling or notice that the numbness/tingling is affecting the legs or saddle region, go to ER. If you ever lose continence go to ER.        ED Prescriptions    Medication Sig Dispense Auth. Provider   diclofenac (VOLTAREN) 50 MG EC tablet Take 1 tablet (50 mg total) by mouth 2 (two) times daily for 7 days. 14 tablet Eusebio Friendly B, PA-C   baclofen (LIORESAL) 10 MG tablet Take 1 tablet (10 mg total) by mouth 3 (three) times daily as needed for up to 5 days for muscle spasms. 15 each Shirlee Latch, PA-C     I have reviewed the PDMP during this encounter.   Shirlee Latch, PA-C 10/10/20 1421

## 2020-10-10 NOTE — Discharge Instructions (Addendum)
NECK PAIN: Stressed avoiding painful activities. This can exacerbate your symptoms and make them worse.  May apply heat to the areas of pain for some relief. Use medications as directed. Be aware of which medications make you drowsy and do not drive or operate any kind of heavy machinery while using the medication (ie pain medications or muscle relaxers). F/U with PCP for reexamination or return sooner if condition worsens or does not begin to improve over the next few days.   NECK PAIN RED FLAGS: If symptoms get worse than they are right now, you should come back sooner for re-evaluation. If you have increased numbness/ tingling or notice that the numbness/tingling is affecting the legs or saddle region, go to ER. If you ever lose continence go to ER.     

## 2021-05-02 ENCOUNTER — Ambulatory Visit
Admission: EM | Admit: 2021-05-02 | Discharge: 2021-05-02 | Disposition: A | Discharge status: Home / Self Care | Payer: Medicare Other | Attending: Physician Assistant | Admitting: Physician Assistant

## 2021-05-02 ENCOUNTER — Other Ambulatory Visit: Payer: Self-pay

## 2021-05-02 DIAGNOSIS — G479 Sleep disorder, unspecified: Secondary | ICD-10-CM | POA: Diagnosis not present

## 2021-05-02 DIAGNOSIS — F419 Anxiety disorder, unspecified: Secondary | ICD-10-CM

## 2021-05-02 MED ORDER — HYDROXYZINE HCL 25 MG PO TABS: ORAL_TABLET | ORAL | 0 refills | Status: AC

## 2021-05-02 MED ORDER — TRAZODONE HCL 50 MG PO TABS: ORAL_TABLET | ORAL | 0 refills | Status: AC

## 2021-05-02 NOTE — ED Triage Notes (Signed)
Pt reports feeling anxious for the last 2 weeks. Also sts she is not able to sleep during the night.

## 2021-05-02 NOTE — Discharge Instructions (Addendum)
I have sent a medication called trazodone for you to take at bedtime to see if that helps with your insomnia/sleeping problems.  Start with half a tablet nightly for the first 3 days.  If you are still having issues with sleep he can increase to 1 tablet nightly.  Discuss this medication with your primary care provider on September 28 when you go to your appointment.  If that is helpful they may continue you on it.  If it is not they may decide to switch you to something different.  I have sent in hydroxyzine for you to take during the day as needed for anxiety.  Try to reduce stressors.  I would suggest you go to [email or web address] [email or web address] to try to find a therapist to speak to.  If you feel like your anxiety is worse or you have panic attacks we can see your primary care provider or go to the emergency department.

## 2021-05-02 NOTE — ED Provider Notes (Signed)
[facility]    CSN: [id number] Arrival date & time: 05/02/21  1158      History   Chief Complaint Chief Complaint  Patient presents with   Anxiety    HPI Shelly Mitchell is a 70 y.o. female presenting for complaints of "not sleeping well" for the past 2 weeks.  She says she has increased anxiety and stress.  Patient says a lot of her stress revolves around her son who is a drug user.  She says she occasionally has panic attacks where her mind is racing but she denies any sort of chest pain or breathing issue.  Patient says during the day she is able to distract herself with playing games and other activities but at nighttime her stress and anxiety is more difficult to deal with and keeps her awake at night.  She has tried hydroxyzine in the past but says it seems to make her a little drowsy the next morning.  He does have an appoint with her primary care provider in 3 weeks but wanted to be seen at the urgent care sooner to see if there was something that she could take.  She does admit to some depressed moods at times but denies any SI/HI.  She denies any weight changes or appetite changes.  She has no other complaints.  HPI  Past Medical History:  Diagnosis Date   Hypercholesterolemia    Stroke [facility])    Tobacco abuse     Patient Active Problem List   Diagnosis Date Noted   CVA (cerebral vascular accident) (HCC) 07/27/2018    Past Surgical History:  Procedure Laterality Date   CESAREAN SECTION     CHOLECYSTECTOMY      OB History   No obstetric history on file.      Home Medications    Prior to Admission medications   Medication Sig Start Date End Date Taking? Authorizing Provider  hydrOXYzine (ATARAX/VISTARIL) 25 MG tablet Take 0.5 -1 tab PO TID PRN anxiety 05/02/21  Yes Shirlee Latch, PA-C  traZODone (DESYREL) 50 MG tablet Take 0.5 tablet PO at bed time x 3 days and then increase to 1 tab nightly 05/02/21 06/01/21 Yes Shirlee Latch, PA-C  aspirin EC  81 MG tablet Take 1 tablet (81 mg total) by mouth daily. 07/28/18   Milagros Loll, MD  lisinopril (PRINIVIL,ZESTRIL) 5 MG tablet Take 1 tablet (5 mg total) by mouth daily. 07/28/18 03/01/20  Milagros Loll, MD  lisinopril (ZESTRIL) 5 MG tablet Take by mouth. 09/12/20   [provider]  rosuvastatin (CRESTOR) 10 MG tablet Take 10 mg by mouth daily.    [provider]  rosuvastatin (CRESTOR) 20 MG tablet Take by mouth. 07/04/20   [provider]  atorvastatin (LIPITOR) 40 MG tablet Take 1 tablet (40 mg total) by mouth daily at 6 PM. 07/28/18 02/08/20  Milagros Loll, MD    Family History Family History  Problem Relation Age of Onset   CVA Mother     Social History Social History   Tobacco Use   Smoking status: Former    Types: Cigarettes    Quit date: 07/27/2018    Years since quitting: 2.7   Smokeless tobacco: Never  Vaping Use   Vaping Use: Never used  Substance Use Topics   Alcohol use: No   Drug use: No     Allergies   Patient has no known allergies.   Review of Systems Review of Systems  Constitutional:  Negative  for appetite change, fatigue and unexpected weight change.  Neurological:  Negative for dizziness and headaches.  Psychiatric/Behavioral:  Positive for dysphoric mood (mild, at times) and sleep disturbance. Negative for confusion, self-injury and suicidal ideas. The patient is nervous/anxious.     Physical Exam Triage Vital Signs ED Triage Vitals  Enc Vitals Group     BP 05/02/21 1214 121/67     Pulse Rate 05/02/21 1214 72     Resp 05/02/21 1214 18     Temp 05/02/21 1214 98.9 F (37.2 C)     Temp Source 05/02/21 1214 Oral     SpO2 05/02/21 1214 100 %     Weight 05/02/21 1215 180 lb (81.6 kg)     Height 05/02/21 1215 5\' 2"  (1.575 m)     Head Circumference --      Peak Flow --      Pain Score 05/02/21 1215 0     Pain Loc --      Pain Edu? --      Excl. in [place]? --    No data found.  Updated Vital Signs BP 121/67   Pulse 72    Temp 98.9 F (37.2 C) (Oral)   Resp 18   Ht 5\' 2"  (1.575 m)   Wt 180 lb (81.6 kg)   SpO2 100%   BMI 32.92 kg/m       Physical Exam Vitals and nursing note reviewed.  Constitutional:      General: She is not in acute distress.    Appearance: Normal appearance. She is not ill-appearing or toxic-appearing.  HENT:     Head: Normocephalic and atraumatic.     Nose: Nose normal.     Mouth/Throat:     Mouth: Mucous membranes are moist.     Pharynx: Oropharynx is clear.  Eyes:     General: No scleral icterus.       Right eye: No discharge.        Left eye: No discharge.     Extraocular Movements: Extraocular movements intact.     Conjunctiva/sclera: Conjunctivae normal.     Pupils: Pupils are equal, round, and reactive to light.  Cardiovascular:     Rate and Rhythm: Normal rate and regular rhythm.     Heart sounds: Normal heart sounds.  Pulmonary:     Effort: Pulmonary effort is normal. No respiratory distress.     Breath sounds: Normal breath sounds.  Musculoskeletal:     Cervical back: Neck supple.  Skin:    General: Skin is dry.  Neurological:     General: No focal deficit present.     Mental Status: She is alert and oriented to person, place, and time. Mental status is at baseline.     Cranial Nerves: No cranial nerve deficit.     Motor: No weakness.     Gait: Gait normal.  Psychiatric:        Mood and Affect: Mood normal.        Behavior: Behavior normal.        Thought Content: Thought content normal.     UC Treatments / Results  Labs (all labs ordered are listed, but only abnormal results are displayed) Labs Reviewed - No data to display  EKG   Radiology No results found.  Procedures Procedures (including critical care time)  Medications Ordered in UC Medications - No data to display  Initial Impression / Assessment and Plan / UC Course  I have reviewed the triage vital signs  and the nursing notes.  Pertinent labs & imaging results that were  available during my care of the patient were reviewed by me and considered in my medical decision making (see chart for details).  70 year old female presenting for anxiety and sleep disturbances over the past 2 weeks.  She does have an appointment with her primary care provider in 3 weeks.  We will try patient on trazodone at bedtime to help with sleep.  I am sent in hydroxyzine.  Take during the day if needed for anxiety/panic attacks.  Encouraged her to find a therapist.  Reviewed ED precautions with her.  Final Clinical Impressions(s) / UC Diagnoses   Final diagnoses:  Sleep disturbances  Anxiety     Discharge Instructions      I have sent a medication called trazodone for you to take at bedtime to see if that helps with your insomnia/sleeping problems.  Start with half a tablet nightly for the first 3 days.  If you are still having issues with sleep he can increase to 1 tablet nightly.  Discuss this medication with your primary care provider on September 28 when you go to your appointment.  If that is helpful they may continue you on it.  If it is not they may decide to switch you to something different.  I have sent in hydroxyzine for you to take during the day as needed for anxiety.  Try to reduce stressors.  I would suggest you go to [email or web address] to try to find a therapist to speak to.  If you feel like your anxiety is worse or you have panic attacks we can see your primary care provider or go to the emergency department.     ED Prescriptions     Medication Sig Dispense Auth. Provider   traZODone (DESYREL) 50 MG tablet Take 0.5 tablet PO at bed time x 3 days and then increase to 1 tab nightly 30 tablet Eusebio Friendly B, PA-C   hydrOXYzine (ATARAX/VISTARIL) 25 MG tablet Take 0.5 -1 tab PO TID PRN anxiety 30 tablet Shirlee Latch, PA-C      PDMP not reviewed this encounter.   Shirlee Latch, PA-C 05/02/21 1315

## 2021-08-29 DIAGNOSIS — H524 Presbyopia: Secondary | ICD-10-CM | POA: Diagnosis not present

## 2021-09-24 DIAGNOSIS — F410 Panic disorder [episodic paroxysmal anxiety] without agoraphobia: Secondary | ICD-10-CM | POA: Diagnosis not present

## 2021-09-24 DIAGNOSIS — F411 Generalized anxiety disorder: Secondary | ICD-10-CM | POA: Diagnosis not present

## 2021-11-26 DIAGNOSIS — G4700 Insomnia, unspecified: Secondary | ICD-10-CM | POA: Diagnosis not present

## 2021-11-26 DIAGNOSIS — E785 Hyperlipidemia, unspecified: Secondary | ICD-10-CM | POA: Diagnosis not present

## 2021-11-26 DIAGNOSIS — I10 Essential (primary) hypertension: Secondary | ICD-10-CM | POA: Diagnosis not present

## 2021-11-26 DIAGNOSIS — F411 Generalized anxiety disorder: Secondary | ICD-10-CM | POA: Diagnosis not present

## 2021-11-26 DIAGNOSIS — F410 Panic disorder [episodic paroxysmal anxiety] without agoraphobia: Secondary | ICD-10-CM | POA: Diagnosis not present

## 2021-11-26 DIAGNOSIS — Z8673 Personal history of transient ischemic attack (TIA), and cerebral infarction without residual deficits: Secondary | ICD-10-CM | POA: Diagnosis not present

## 2022-01-01 DIAGNOSIS — H2513 Age-related nuclear cataract, bilateral: Secondary | ICD-10-CM | POA: Diagnosis not present

## 2022-01-06 ENCOUNTER — Emergency Department: Payer: No Typology Code available for payment source

## 2022-01-06 ENCOUNTER — Emergency Department
Admission: EM | Admit: 2022-01-06 | Discharge: 2022-01-06 | Disposition: A | Discharge status: Home / Self Care | Payer: No Typology Code available for payment source | Attending: Emergency Medicine | Admitting: Emergency Medicine

## 2022-01-06 ENCOUNTER — Other Ambulatory Visit: Payer: Self-pay

## 2022-01-06 ENCOUNTER — Encounter: Payer: Self-pay | Admitting: Emergency Medicine

## 2022-01-06 DIAGNOSIS — M542 Cervicalgia: Secondary | ICD-10-CM | POA: Diagnosis not present

## 2022-01-06 DIAGNOSIS — M546 Pain in thoracic spine: Secondary | ICD-10-CM | POA: Diagnosis not present

## 2022-01-06 DIAGNOSIS — Y92410 Unspecified street and highway as the place of occurrence of the external cause: Secondary | ICD-10-CM | POA: Insufficient documentation

## 2022-01-06 DIAGNOSIS — S0990XA Unspecified injury of head, initial encounter: Secondary | ICD-10-CM | POA: Diagnosis not present

## 2022-01-06 DIAGNOSIS — S199XXA Unspecified injury of neck, initial encounter: Secondary | ICD-10-CM | POA: Insufficient documentation

## 2022-01-06 DIAGNOSIS — M25561 Pain in right knee: Secondary | ICD-10-CM | POA: Diagnosis not present

## 2022-01-06 DIAGNOSIS — S4991XA Unspecified injury of right shoulder and upper arm, initial encounter: Secondary | ICD-10-CM | POA: Diagnosis not present

## 2022-01-06 DIAGNOSIS — M25511 Pain in right shoulder: Secondary | ICD-10-CM | POA: Diagnosis not present

## 2022-01-06 DIAGNOSIS — M40204 Unspecified kyphosis, thoracic region: Secondary | ICD-10-CM | POA: Diagnosis not present

## 2022-01-06 DIAGNOSIS — S8992XA Unspecified injury of left lower leg, initial encounter: Secondary | ICD-10-CM | POA: Diagnosis not present

## 2022-01-06 DIAGNOSIS — M5412 Radiculopathy, cervical region: Secondary | ICD-10-CM | POA: Diagnosis not present

## 2022-01-06 MED ORDER — BACLOFEN 10 MG PO TABS: 10.0000 mg | ORAL_TABLET | Freq: Three times a day (TID) | ORAL | 0 refills | Status: AC | PRN
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
## 2022-01-06 NOTE — Discharge Instructions (Addendum)
You can take Baclofen up to three times daily.  ?

## 2022-01-09 DIAGNOSIS — I517 Cardiomegaly: Secondary | ICD-10-CM | POA: Diagnosis not present

## 2022-01-09 DIAGNOSIS — M25511 Pain in right shoulder: Secondary | ICD-10-CM | POA: Diagnosis not present

## 2022-01-09 DIAGNOSIS — R0789 Other chest pain: Secondary | ICD-10-CM | POA: Diagnosis not present

## 2022-01-17 ENCOUNTER — Encounter: Payer: Self-pay | Admitting: Ophthalmology

## 2022-01-24 NOTE — Discharge Instructions (Signed)

## 2022-01-28 ENCOUNTER — Encounter: Payer: Self-pay | Admitting: Ophthalmology

## 2022-01-28 ENCOUNTER — Encounter
Admission: RE | Disposition: A | Discharge status: Home / Self Care | Payer: Self-pay | Source: Home / Self Care | Attending: Ophthalmology

## 2022-01-28 ENCOUNTER — Ambulatory Visit
Admission: RE | Admit: 2022-01-28 | Discharge: 2022-01-28 | Disposition: A | Discharge status: Home / Self Care | Payer: No Typology Code available for payment source | Attending: Ophthalmology | Admitting: Ophthalmology

## 2022-01-28 ENCOUNTER — Ambulatory Visit: Payer: No Typology Code available for payment source | Admitting: Anesthesiology

## 2022-01-28 DIAGNOSIS — H2512 Age-related nuclear cataract, left eye: Secondary | ICD-10-CM | POA: Insufficient documentation

## 2022-01-28 DIAGNOSIS — Z8673 Personal history of transient ischemic attack (TIA), and cerebral infarction without residual deficits: Secondary | ICD-10-CM | POA: Diagnosis not present

## 2022-01-28 DIAGNOSIS — H2181 Floppy iris syndrome: Secondary | ICD-10-CM | POA: Insufficient documentation

## 2022-01-28 DIAGNOSIS — Z87891 Personal history of nicotine dependence: Secondary | ICD-10-CM | POA: Diagnosis not present

## 2022-01-28 DIAGNOSIS — Z6835 Body mass index (BMI) 35.0-35.9, adult: Secondary | ICD-10-CM | POA: Diagnosis not present

## 2022-01-28 DIAGNOSIS — H25812 Combined forms of age-related cataract, left eye: Secondary | ICD-10-CM | POA: Diagnosis not present

## 2022-01-28 DIAGNOSIS — I10 Essential (primary) hypertension: Secondary | ICD-10-CM | POA: Diagnosis not present

## 2022-01-28 HISTORY — DX: Presence of dental prosthetic device (complete) (partial): Z97.2

## 2022-01-28 HISTORY — PX: CATARACT EXTRACTION W/PHACO: SHX586

## 2022-01-28 SURGERY — PHACOEMULSIFICATION, CATARACT, WITH IOL INSERTION
Anesthesia: Monitor Anesthesia Care | Site: Eye | Laterality: Left

## 2022-01-28 MED ORDER — SIGHTPATH DOSE#1 BSS IO SOLN
INTRAOCULAR | Status: DC | PRN
  Administered 2022-01-28: 15 mL via INTRAOCULAR

## 2022-01-28 MED ORDER — LIDOCAINE HCL (PF) 2 % IJ SOLN
INTRAOCULAR | Status: DC | PRN
  Administered 2022-01-28: 1 mL via INTRAOCULAR

## 2022-01-28 MED ORDER — SIGHTPATH DOSE#1 SODIUM HYALURONATE 10 MG/ML IO SOLUTION
PREFILLED_SYRINGE | INTRAOCULAR | Status: DC | PRN
  Administered 2022-01-28: 0.85 mL via INTRAOCULAR

## 2022-01-28 MED ORDER — ARMC OPHTHALMIC DILATING DROPS
1.0000 "application " | OPHTHALMIC | Status: DC | PRN
  Administered 2022-01-28 (×3): 1 via OPHTHALMIC

## 2022-01-28 MED ORDER — MIDAZOLAM HCL 2 MG/2ML IJ SOLN
INTRAMUSCULAR | Status: DC | PRN
  Administered 2022-01-28: 1 mg via INTRAVENOUS

## 2022-01-28 MED ORDER — OXYCODONE HCL 5 MG/5ML PO SOLN: 5.0000 mg | Freq: Once | ORAL | Status: DC | PRN

## 2022-01-28 MED ORDER — OXYCODONE HCL 5 MG PO TABS: 5.0000 mg | ORAL_TABLET | Freq: Once | ORAL | Status: DC | PRN

## 2022-01-28 MED ORDER — SIGHTPATH DOSE#1 BSS IO SOLN
INTRAOCULAR | Status: DC | PRN
  Administered 2022-01-28: 79 mL via OPHTHALMIC

## 2022-01-28 MED ORDER — MOXIFLOXACIN HCL 0.5 % OP SOLN
OPHTHALMIC | Status: DC | PRN
  Administered 2022-01-28: 0.2 mL via OPHTHALMIC

## 2022-01-28 MED ORDER — FENTANYL CITRATE (PF) 100 MCG/2ML IJ SOLN
INTRAMUSCULAR | Status: DC | PRN
  Administered 2022-01-28: 50 ug via INTRAVENOUS

## 2022-01-28 MED ORDER — TETRACAINE HCL 0.5 % OP SOLN
1.0000 [drp] | OPHTHALMIC | Status: DC | PRN
  Administered 2022-01-28 (×3): 1 [drp] via OPHTHALMIC

## 2022-01-28 MED ORDER — SIGHTPATH DOSE#1 SODIUM HYALURONATE 23 MG/ML IO SOLUTION
PREFILLED_SYRINGE | INTRAOCULAR | Status: DC | PRN
  Administered 2022-01-28: 0.6 mL via INTRAOCULAR

## 2022-01-28 MED ORDER — LACTATED RINGERS IV SOLN: INTRAVENOUS | Status: DC

## 2022-01-28 SURGICAL SUPPLY — 17 items
CANNULA ANT/CHMB 27G (MISCELLANEOUS) IMPLANT
CANNULA ANT/CHMB 27GA (MISCELLANEOUS) IMPLANT
CATARACT SUITE SIGHTPATH (MISCELLANEOUS) ×2 IMPLANT
DISSECTOR HYDRO NUCLEUS 50X22 (MISCELLANEOUS) ×2 IMPLANT
FEE CATARACT SUITE SIGHTPATH (MISCELLANEOUS) ×1 IMPLANT
GLOVE SURG GAMMEX PI TX LF 7.5 (GLOVE) ×2 IMPLANT
GLOVE SURG SYN 8.5  E (GLOVE) ×1
GLOVE SURG SYN 8.5 E (GLOVE) ×1 IMPLANT
GLOVE SURG SYN 8.5 PF PI (GLOVE) ×1 IMPLANT
LENS IOL TECNIS EYHANCE 22.0 (Intraocular Lens) ×1 IMPLANT
NDL FILTER BLUNT 18X1 1/2 (NEEDLE) ×1 IMPLANT
NEEDLE FILTER BLUNT 18X 1/2SAF (NEEDLE) ×1
NEEDLE FILTER BLUNT 18X1 1/2 (NEEDLE) ×1 IMPLANT
RING MALYGIN (MISCELLANEOUS) ×1 IMPLANT
SYR 3ML LL SCALE MARK (SYRINGE) ×2 IMPLANT
SYR 5ML LL (SYRINGE) ×2 IMPLANT
WATER STERILE IRR 250ML POUR (IV SOLUTION) ×2 IMPLANT

## 2022-01-28 NOTE — Transfer of Care (Signed)
Immediate Anesthesia Transfer of Care Note  Patient: Shelly Mitchell  Procedure(s) Performed: CATARACT EXTRACTION PHACO AND INTRAOCULAR LENS PLACEMENT (IOC) LEFT 3.27 00:33.1 (Left: Eye)  Patient Location: PACU  Anesthesia Type: MAC  Level of Consciousness: awake, alert  and patient cooperative  Airway and Oxygen Therapy: Patient Spontanous Breathing and Patient connected to supplemental oxygen  Post-op Assessment: Post-op Vital signs reviewed, Patient's Cardiovascular Status Stable, Respiratory Function Stable, Patent Airway and No signs of Nausea or vomiting  Post-op Vital Signs: Reviewed and stable  Complications: No notable events documented.

## 2022-01-28 NOTE — Op Note (Signed)
OPERATIVE NOTE  FANI ROTONDO [id number] 01/28/2022   PREOPERATIVE DIAGNOSIS:  Nuclear sclerotic cataract left eye.  H25.12   POSTOPERATIVE DIAGNOSIS:      Nuclear sclerotic cataract left eye.   Intraoperative floppy iris syndrome.   PROCEDURE:  CPT [phone] Complex Phacoemusification with posterior chamber intraocular lens placement of the left eye, requiring malyugin ring for dilation and stabilization of the iris.  LENS:   Implant Name Type Inv. Item Serial No. Manufacturer Lot No. LRB No. Used Action  LENS IOL TECNIS EYHANCE 22.0 - [id number] Intraocular Lens LENS IOL TECNIS EYHANCE 22.0 [id number] [employer]  Left 1 Implanted      Procedure(s): CATARACT EXTRACTION PHACO AND INTRAOCULAR LENS PLACEMENT (IOC) LEFT 3.27 00:33.1 (Left)  DIB00 +22.0   ULTRASOUND TIME: 0 minutes 33 seconds.  CDE 3.27   SURGEON:  Willey Blade, MD, MPH   ANESTHESIA:  Topical with tetracaine drops augmented with 1% preservative-free intracameral lidocaine.  ESTIMATED BLOOD LOSS: <1 mL   COMPLICATIONS:  None.   DESCRIPTION OF PROCEDURE:  The patient was identified in the holding room and transported to the operating room and placed in the supine position under the operating microscope.  The left eye was identified as the operative eye and it was prepped and draped in the usual sterile ophthalmic fashion.   A 1.0 millimeter clear-corneal paracentesis was made at the 5:00 position. 0.5 ml of preservative-free 1% lidocaine with epinephrine was injected into the anterior chamber.  The anterior chamber was filled with Healon 5 viscoelastic.  A 2.4 millimeter keratome was used to make a near-clear corneal incision at the 2:00 position.    The pupil was small and iris was floppy, so a malyugin ring was placed with good effect.  A curvilinear capsulorrhexis was made with a cystotome and capsulorrhexis forceps.  Balanced salt solution was used to hydrodissect and hydrodelineate the nucleus.    Phacoemulsification was then used in stop and chop fashion to remove the lens nucleus and epinucleus.  The remaining cortex was then removed using the irrigation and aspiration handpiece. Healon was then placed into the capsular bag to distend it for lens placement.  A lens was then injected into the capsular bag.   The ring was removed.  The remaining viscoelastic was aspirated.   Wounds were hydrated with balanced salt solution.  The anterior chamber was inflated to a physiologic pressure with balanced salt solution.  Intracameral vigamox 0.1 mL undiltued was injected into the eye and a drop placed onto the ocular surface.  No wound leaks were noted.  The patient was taken to the recovery room in stable condition without complications of anesthesia or surgery  Willey Blade 01/28/2022, 9:46 AM

## 2022-01-28 NOTE — Anesthesia Postprocedure Evaluation (Signed)
Anesthesia Post Note  Patient: Shelly Mitchell  Procedure(s) Performed: CATARACT EXTRACTION PHACO AND INTRAOCULAR LENS PLACEMENT (IOC) LEFT 3.27 00:33.1 (Left: Eye)     Patient location during evaluation: PACU Anesthesia Type: MAC Level of consciousness: awake and alert Pain management: pain level controlled Vital Signs Assessment: post-procedure vital signs reviewed and stable Respiratory status: spontaneous breathing, nonlabored ventilation, respiratory function stable and patient connected to nasal cannula oxygen Cardiovascular status: stable and blood pressure returned to baseline Postop Assessment: no apparent nausea or vomiting Anesthetic complications: no   No notable events documented.  Fidel Levy

## 2022-01-28 NOTE — H&P (Signed)
[facility]   Primary Care Physician:  Marina Goodell, MD Ophthalmologist: Dr. Willey Blade  Pre-Procedure History & Physical: HPI:  Shelly Mitchell is a 71 y.o. female here for cataract surgery.   Past Medical History:  Diagnosis Date   Hypercholesterolemia    Stroke (HCC) 2019   No deficits   Tobacco abuse    Wears dentures    full upper    Past Surgical History:  Procedure Laterality Date   CESAREAN SECTION     CHOLECYSTECTOMY      Prior to Admission medications   Medication Sig Start Date End Date Taking? Authorizing Provider  aspirin EC 81 MG tablet Take 1 tablet (81 mg total) by mouth daily. 07/28/18  Yes Sudini, Wardell Heath, MD  celecoxib (CELEBREX) 200 MG capsule Take 200 mg by mouth 2 (two) times daily.   Yes [provider]  FLUoxetine (PROZAC) 10 MG tablet Take 10 mg by mouth daily.   Yes [provider]  lisinopril (ZESTRIL) 5 MG tablet Take 10 mg by mouth. 09/12/20  Yes [provider]  rosuvastatin (CRESTOR) 20 MG tablet Take by mouth. 07/04/20  Yes [provider]  traZODone (DESYREL) 50 MG tablet Take 0.5 tablet PO at bed time x 3 days and then increase to 1 tab nightly 05/02/21 01/28/22 Yes Eusebio Friendly B, PA-C  hydrOXYzine (ATARAX/VISTARIL) 25 MG tablet Take 0.5 -1 tab PO TID PRN anxiety Patient not taking: Reported on 01/17/2022 05/02/21   Shirlee Latch, PA-C  atorvastatin (LIPITOR) 40 MG tablet Take 1 tablet (40 mg total) by mouth daily at 6 PM. 07/28/18 02/08/20  Milagros Loll, MD    Allergies as of 01/03/2022   (No Known Allergies)    Family History  Problem Relation Age of Onset   CVA Mother     Social History   Socioeconomic History   Marital status: Divorced    Spouse name: Not on file   Number of children: Not on file   Years of education: Not on file   Highest education level: Not on file  Occupational History   Not on file  Tobacco Use   Smoking status: Former    Types: Cigarettes    Quit  date: 07/27/2018    Years since quitting: 3.5   Smokeless tobacco: Never  Vaping Use   Vaping Use: Never used  Substance and Sexual Activity   Alcohol use: No   Drug use: No   Sexual activity: Not on file  Other Topics Concern   Not on file  Social History Narrative   Not on file   Social Determinants of Health   Financial Resource Strain: Not on file  Food Insecurity: Not on file  Transportation Needs: Not on file  Physical Activity: Not on file  Stress: Not on file  Social Connections: Not on file  Intimate Partner Violence: Not on file    Review of Systems: See HPI, otherwise negative ROS  Physical Exam: BP (!) 167/68   Pulse 64   Temp (!) 97.3 F (36.3 C) (Temporal)   Ht 5\' 2"  (1.575 m)   Wt 86 kg   SpO2 95%   BMI 34.68 kg/m  General:   Alert, cooperative in NAD Head:  Normocephalic and atraumatic. Respiratory:  Normal work of breathing. Cardiovascular:  RRR  Impression/Plan: Shelly Mitchell is here for cataract surgery.  Risks, benefits, limitations, and alternatives regarding cataract surgery have been reviewed with the patient.  Questions have been answered.  All parties agreeable.   Willey Blade, MD  01/28/2022, 9:18 AM
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
## 2022-01-29 ENCOUNTER — Other Ambulatory Visit: Payer: Self-pay

## 2022-01-29 ENCOUNTER — Encounter: Payer: Self-pay | Admitting: Ophthalmology

## 2022-02-01 NOTE — Anesthesia Preprocedure Evaluation (Addendum)
Anesthesia Evaluation  Patient identified by MRN, date of birth, ID band Patient awake    Reviewed: Allergy & Precautions, NPO status , Patient's Chart, lab work & pertinent test results, reviewed documented beta blocker date and time   History of Anesthesia Complications Negative for: history of anesthetic complications  Airway Mallampati: III  TM Distance: >3 FB Neck ROM: Limited    Dental  (+) Upper Dentures, Edentulous Lower   Pulmonary former smoker,    breath sounds clear to auscultation       Cardiovascular (-) angina(-) DOE  Rhythm:Regular Rate:Normal   HLD   Neuro/Psych CVA    GI/Hepatic neg GERD  ,  Endo/Other    Renal/GU      Musculoskeletal   Abdominal   Peds  Hematology   Anesthesia Other Findings   Reproductive/Obstetrics                            Anesthesia Physical Anesthesia Plan  ASA: 3  Anesthesia Plan: MAC   Post-op Pain Management:    Induction: Intravenous  PONV Risk Score and Plan: 2 and TIVA, Midazolam and Treatment may vary due to age or medical condition  Airway Management Planned: Nasal Cannula  Additional Equipment:   Intra-op Plan:   Post-operative Plan:   Informed Consent: I have reviewed the patients History and Physical, chart, labs and discussed the procedure including the risks, benefits and alternatives for the proposed anesthesia with the patient or authorized representative who has indicated his/her understanding and acceptance.       Plan Discussed with: CRNA and Anesthesiologist  Anesthesia Plan Comments:        Anesthesia Quick Evaluation

## 2022-02-06 NOTE — Discharge Instructions (Signed)

## 2022-02-11 ENCOUNTER — Encounter
Admission: RE | Disposition: A | Discharge status: Home / Self Care | Payer: Self-pay | Source: Home / Self Care | Attending: Ophthalmology

## 2022-02-11 ENCOUNTER — Other Ambulatory Visit: Payer: Self-pay

## 2022-02-11 ENCOUNTER — Ambulatory Visit: Payer: No Typology Code available for payment source | Admitting: Anesthesiology

## 2022-02-11 ENCOUNTER — Ambulatory Visit
Admission: RE | Admit: 2022-02-11 | Discharge: 2022-02-11 | Disposition: A | Discharge status: Home / Self Care | Payer: No Typology Code available for payment source | Attending: Ophthalmology | Admitting: Ophthalmology

## 2022-02-11 DIAGNOSIS — Z8673 Personal history of transient ischemic attack (TIA), and cerebral infarction without residual deficits: Secondary | ICD-10-CM | POA: Diagnosis not present

## 2022-02-11 DIAGNOSIS — H25811 Combined forms of age-related cataract, right eye: Secondary | ICD-10-CM | POA: Diagnosis not present

## 2022-02-11 DIAGNOSIS — H2511 Age-related nuclear cataract, right eye: Secondary | ICD-10-CM | POA: Insufficient documentation

## 2022-02-11 DIAGNOSIS — E785 Hyperlipidemia, unspecified: Secondary | ICD-10-CM | POA: Insufficient documentation

## 2022-02-11 DIAGNOSIS — I639 Cerebral infarction, unspecified: Secondary | ICD-10-CM

## 2022-02-11 DIAGNOSIS — Z87891 Personal history of nicotine dependence: Secondary | ICD-10-CM | POA: Diagnosis not present

## 2022-02-11 HISTORY — PX: CATARACT EXTRACTION W/PHACO: SHX586

## 2022-02-11 SURGERY — PHACOEMULSIFICATION, CATARACT, WITH IOL INSERTION
Anesthesia: Monitor Anesthesia Care | Site: Eye | Laterality: Right

## 2022-02-11 MED ORDER — TETRACAINE HCL 0.5 % OP SOLN
1.0000 [drp] | OPHTHALMIC | Status: DC | PRN
  Administered 2022-02-11 (×3): 1 [drp] via OPHTHALMIC

## 2022-02-11 MED ORDER — ONDANSETRON HCL 4 MG/2ML IJ SOLN: 4.0000 mg | Freq: Once | INTRAMUSCULAR | Status: DC | PRN

## 2022-02-11 MED ORDER — SIGHTPATH DOSE#1 BSS IO SOLN
INTRAOCULAR | Status: DC | PRN
  Administered 2022-02-11: 15 mL

## 2022-02-11 MED ORDER — SIGHTPATH DOSE#1 BSS IO SOLN
INTRAOCULAR | Status: DC | PRN
  Administered 2022-02-11: 85 mL via OPHTHALMIC

## 2022-02-11 MED ORDER — MIDAZOLAM HCL 2 MG/2ML IJ SOLN
INTRAMUSCULAR | Status: DC | PRN
  Administered 2022-02-11: 1 mg via INTRAVENOUS

## 2022-02-11 MED ORDER — LIDOCAINE HCL (PF) 2 % IJ SOLN
INTRAOCULAR | Status: DC | PRN
  Administered 2022-02-11: 1 mL via INTRAOCULAR

## 2022-02-11 MED ORDER — SIGHTPATH DOSE#1 SODIUM HYALURONATE 23 MG/ML IO SOLUTION
PREFILLED_SYRINGE | INTRAOCULAR | Status: DC | PRN
  Administered 2022-02-11: 0.6 mL via INTRAOCULAR

## 2022-02-11 MED ORDER — ACETAMINOPHEN 160 MG/5ML PO SOLN: 325.0000 mg | ORAL | Status: DC | PRN

## 2022-02-11 MED ORDER — FENTANYL CITRATE (PF) 100 MCG/2ML IJ SOLN
INTRAMUSCULAR | Status: DC | PRN
  Administered 2022-02-11: 50 ug via INTRAVENOUS

## 2022-02-11 MED ORDER — LACTATED RINGERS IV SOLN: INTRAVENOUS | Status: DC

## 2022-02-11 MED ORDER — ARMC OPHTHALMIC DILATING DROPS
1.0000 "application " | OPHTHALMIC | Status: DC | PRN
  Administered 2022-02-11 (×3): 1 via OPHTHALMIC

## 2022-02-11 MED ORDER — SIGHTPATH DOSE#1 SODIUM HYALURONATE 10 MG/ML IO SOLUTION
PREFILLED_SYRINGE | INTRAOCULAR | Status: DC | PRN
  Administered 2022-02-11: 0.85 mL via INTRAOCULAR

## 2022-02-11 MED ORDER — MOXIFLOXACIN HCL 0.5 % OP SOLN
OPHTHALMIC | Status: DC | PRN
  Administered 2022-02-11: 0.2 mL via OPHTHALMIC

## 2022-02-11 MED ORDER — ACETAMINOPHEN 325 MG PO TABS: 650.0000 mg | ORAL_TABLET | ORAL | Status: DC | PRN

## 2022-02-11 SURGICAL SUPPLY — 14 items
CATARACT SUITE SIGHTPATH (MISCELLANEOUS) ×2 IMPLANT
DISSECTOR HYDRO NUCLEUS 50X22 (MISCELLANEOUS) ×2 IMPLANT
FEE CATARACT SUITE SIGHTPATH (MISCELLANEOUS) ×1 IMPLANT
GLOVE SURG GAMMEX PI TX LF 7.5 (GLOVE) ×2 IMPLANT
GLOVE SURG SYN 8.5  E (GLOVE) ×1
GLOVE SURG SYN 8.5 E (GLOVE) ×1 IMPLANT
GLOVE SURG SYN 8.5 PF PI (GLOVE) ×1 IMPLANT
LENS IOL TECNIS EYHANCE 22.0 (Intraocular Lens) ×1 IMPLANT
NDL FILTER BLUNT 18X1 1/2 (NEEDLE) ×1 IMPLANT
NEEDLE FILTER BLUNT 18X 1/2SAF (NEEDLE) ×1
NEEDLE FILTER BLUNT 18X1 1/2 (NEEDLE) ×1 IMPLANT
SYR 3ML LL SCALE MARK (SYRINGE) ×2 IMPLANT
SYR 5ML LL (SYRINGE) ×2 IMPLANT
WATER STERILE IRR 250ML POUR (IV SOLUTION) ×2 IMPLANT

## 2022-02-11 NOTE — Op Note (Signed)
OPERATIVE NOTE  Shelly Mitchell [id number] 02/11/2022   PREOPERATIVE DIAGNOSIS:  Nuclear sclerotic cataract right eye.  H25.11   POSTOPERATIVE DIAGNOSIS:    Nuclear sclerotic cataract right eye.     PROCEDURE:  Phacoemusification with posterior chamber intraocular lens placement of the right eye   LENS:   Implant Name Type Inv. Item Serial No. Manufacturer Lot No. LRB No. Used Action  LENS IOL TECNIS EYHANCE 22.0 - [id number] Intraocular Lens LENS IOL TECNIS EYHANCE 22.0 [id number] [employer]  Right 1 Implanted       Procedure(s): CATARACT EXTRACTION PHACO AND INTRAOCULAR LENS PLACEMENT (IOC) RIGHT 3.97 00:37.5 (Right)  DIB00 +22.0   ULTRASOUND TIME: 0 minutes 37 seconds.  CDE 3.97   SURGEON:  Willey Blade, MD, MPH  ANESTHESIOLOGIST: Anesthesiologist: Heniser, Burman Foster, MD CRNA: Jimmy Picket, CRNA   ANESTHESIA:  Topical with tetracaine drops augmented with 1% preservative-free intracameral lidocaine.  ESTIMATED BLOOD LOSS: less than 1 mL.   COMPLICATIONS:  None.   DESCRIPTION OF PROCEDURE:  The patient was identified in the holding room and transported to the operating room and placed in the supine position under the operating microscope.  The right eye was identified as the operative eye and it was prepped and draped in the usual sterile ophthalmic fashion.   A 1.0 millimeter clear-corneal paracentesis was made at the 10:30 position. 0.5 ml of preservative-free 1% lidocaine with epinephrine was injected into the anterior chamber.  The anterior chamber was filled with Healon 5 viscoelastic.  A 2.4 millimeter keratome was used to make a near-clear corneal incision at the 8:00 position.  A curvilinear capsulorrhexis was made with a cystotome and capsulorrhexis forceps.  Balanced salt solution was used to hydrodissect and hydrodelineate the nucleus.   Phacoemulsification was then used in stop and chop fashion to remove the lens nucleus and epinucleus.  The remaining cortex was  then removed using the irrigation and aspiration handpiece. Healon was then placed into the capsular bag to distend it for lens placement.  A lens was then injected into the capsular bag.  The remaining viscoelastic was aspirated.   Wounds were hydrated with balanced salt solution.  The anterior chamber was inflated to a physiologic pressure with balanced salt solution.   Intracameral vigamox 0.1 mL undiluted was injected into the eye and a drop placed onto the ocular surface.  No wound leaks were noted.  The patient was taken to the recovery room in stable condition without complications of anesthesia or surgery  Willey Blade 02/11/2022, 12:03 PM

## 2022-02-11 NOTE — Anesthesia Postprocedure Evaluation (Signed)
Anesthesia Post Note  Patient: Shelly Mitchell  Procedure(s) Performed: CATARACT EXTRACTION PHACO AND INTRAOCULAR LENS PLACEMENT (IOC) RIGHT 3.97 00:37.5 (Right: Eye)     Patient location during evaluation: PACU Anesthesia Type: MAC Level of consciousness: awake and alert Pain management: pain level controlled Vital Signs Assessment: post-procedure vital signs reviewed and stable Respiratory status: spontaneous breathing, nonlabored ventilation, respiratory function stable and patient connected to nasal cannula oxygen Cardiovascular status: stable and blood pressure returned to baseline Postop Assessment: no apparent nausea or vomiting Anesthetic complications: no   No notable events documented.  Jahlil Ziller A  Kathlyne Loud

## 2022-02-11 NOTE — Anesthesia Procedure Notes (Signed)
Procedure Name: MAC Date/Time: 02/11/2022 11:48 AM  Performed by: Mayme Genta, CRNAPre-anesthesia Checklist: Patient identified, Emergency Drugs available, Suction available, Timeout performed and Patient being monitored Patient Re-evaluated:Patient Re-evaluated prior to induction Oxygen Delivery Method: Nasal cannula Placement Confirmation: positive ETCO2

## 2022-02-11 NOTE — Transfer of Care (Signed)
Immediate Anesthesia Transfer of Care Note  Patient: Shelly Mitchell  Procedure(s) Performed: CATARACT EXTRACTION PHACO AND INTRAOCULAR LENS PLACEMENT (IOC) RIGHT 3.97 00:37.5 (Right: Eye)  Patient Location: PACU  Anesthesia Type: MAC  Level of Consciousness: awake, alert  and patient cooperative  Airway and Oxygen Therapy: Patient Spontanous Breathing and Patient connected to supplemental oxygen  Post-op Assessment: Post-op Vital signs reviewed, Patient's Cardiovascular Status Stable, Respiratory Function Stable, Patent Airway and No signs of Nausea or vomiting  Post-op Vital Signs: Reviewed and stable  Complications: No notable events documented.

## 2022-02-11 NOTE — H&P (Signed)
[facility]   Primary Care Physician:  Marina Goodell, MD Ophthalmologist: Dr. Willey Blade  Pre-Procedure History & Physical: HPI:  Shelly Mitchell is a 71 y.o. female here for cataract surgery.   Past Medical History:  Diagnosis Date   Hypercholesterolemia    Stroke (HCC) 2019   No deficits   Tobacco abuse    Wears dentures    full upper    Past Surgical History:  Procedure Laterality Date   CATARACT EXTRACTION W/PHACO Left 01/28/2022   Procedure: CATARACT EXTRACTION PHACO AND INTRAOCULAR LENS PLACEMENT (IOC) LEFT 3.27 00:33.1;  Surgeon: Nevada Crane, MD;  Location: [facility];  Service: Ophthalmology;  Laterality: Left;   CESAREAN SECTION     CHOLECYSTECTOMY      Prior to Admission medications   Medication Sig Start Date End Date Taking? Authorizing Provider  aspirin EC 81 MG tablet Take 1 tablet (81 mg total) by mouth daily. 07/28/18  Yes Sudini, Wardell Heath, MD  celecoxib (CELEBREX) 200 MG capsule Take 200 mg by mouth 2 (two) times daily.   Yes [provider]  FLUoxetine (PROZAC) 10 MG tablet Take 10 mg by mouth daily.   Yes [provider]  hydrOXYzine (ATARAX/VISTARIL) 25 MG tablet Take 0.5 -1 tab PO TID PRN anxiety 05/02/21  Yes Eusebio Friendly B, PA-C  lisinopril (ZESTRIL) 5 MG tablet Take 10 mg by mouth. 09/12/20  Yes [provider]  rosuvastatin (CRESTOR) 20 MG tablet Take by mouth. 07/04/20  Yes [provider]  traZODone (DESYREL) 50 MG tablet Take 0.5 tablet PO at bed time x 3 days and then increase to 1 tab nightly 05/02/21 01/28/22  Eusebio Friendly B, PA-C  atorvastatin (LIPITOR) 40 MG tablet Take 1 tablet (40 mg total) by mouth daily at 6 PM. 07/28/18 02/08/20  Milagros Loll, MD    Allergies as of 01/03/2022   (No Known Allergies)    Family History  Problem Relation Age of Onset   CVA Mother     Social History   Socioeconomic History   Marital status: Divorced    Spouse name: Not on file   Number of  children: Not on file   Years of education: Not on file   Highest education level: Not on file  Occupational History   Not on file  Tobacco Use   Smoking status: Former    Types: Cigarettes    Quit date: 07/27/2018    Years since quitting: 3.5   Smokeless tobacco: Never  Vaping Use   Vaping Use: Never used  Substance and Sexual Activity   Alcohol use: No   Drug use: No   Sexual activity: Not on file  Other Topics Concern   Not on file  Social History Narrative   Not on file   Social Determinants of Health   Financial Resource Strain: Not on file  Food Insecurity: Not on file  Transportation Needs: Not on file  Physical Activity: Not on file  Stress: Not on file  Social Connections: Not on file  Intimate Partner Violence: Not on file    Review of Systems: See HPI, otherwise negative ROS  Physical Exam: BP (!) 152/68   Pulse 62   Temp 97.7 F (36.5 C) (Temporal)   Resp 12   Wt 86.2 kg   SpO2 99%   BMI 34.75 kg/m  General:   Alert, cooperative in NAD Head:  Normocephalic and atraumatic. Respiratory:  Normal work of breathing. Cardiovascular:  RRR  Impression/Plan: Shelly Mitchell  Shelly Mitchell is here for cataract surgery.  Risks, benefits, limitations, and alternatives regarding cataract surgery have been reviewed with the patient.  Questions have been answered.  All parties agreeable.   Willey Blade, MD  02/11/2022, 11:38 AM

## 2022-02-12 ENCOUNTER — Encounter: Payer: Self-pay | Admitting: Ophthalmology

## 2022-06-05 DIAGNOSIS — Z8673 Personal history of transient ischemic attack (TIA), and cerebral infarction without residual deficits: Secondary | ICD-10-CM | POA: Diagnosis not present

## 2022-06-05 DIAGNOSIS — G4700 Insomnia, unspecified: Secondary | ICD-10-CM | POA: Diagnosis not present

## 2022-06-05 DIAGNOSIS — R0789 Other chest pain: Secondary | ICD-10-CM | POA: Diagnosis not present

## 2022-06-05 DIAGNOSIS — F410 Panic disorder [episodic paroxysmal anxiety] without agoraphobia: Secondary | ICD-10-CM | POA: Diagnosis not present

## 2022-06-05 DIAGNOSIS — I10 Essential (primary) hypertension: Secondary | ICD-10-CM | POA: Diagnosis not present

## 2022-06-05 DIAGNOSIS — E785 Hyperlipidemia, unspecified: Secondary | ICD-10-CM | POA: Diagnosis not present

## 2022-06-05 DIAGNOSIS — Z0000 Encounter for general adult medical examination without abnormal findings: Secondary | ICD-10-CM | POA: Diagnosis not present

## 2022-06-05 DIAGNOSIS — F411 Generalized anxiety disorder: Secondary | ICD-10-CM | POA: Diagnosis not present

## 2022-06-05 DIAGNOSIS — Z1231 Encounter for screening mammogram for malignant neoplasm of breast: Secondary | ICD-10-CM | POA: Diagnosis not present

## 2022-06-06 ENCOUNTER — Other Ambulatory Visit: Payer: Self-pay | Admitting: Family Medicine

## 2022-06-06 DIAGNOSIS — Z1231 Encounter for screening mammogram for malignant neoplasm of breast: Secondary | ICD-10-CM

## 2022-06-25 ENCOUNTER — Ambulatory Visit
Admission: RE | Admit: 2022-06-25 | Discharge: 2022-06-25 | Disposition: A | Discharge status: Home / Self Care | Payer: No Typology Code available for payment source | Source: Ambulatory Visit | Attending: Family Medicine | Admitting: Family Medicine

## 2022-06-25 DIAGNOSIS — Z1231 Encounter for screening mammogram for malignant neoplasm of breast: Secondary | ICD-10-CM | POA: Diagnosis not present

## 2022-06-27 ENCOUNTER — Other Ambulatory Visit: Payer: Self-pay

## 2022-06-27 ENCOUNTER — Ambulatory Visit
Admission: EM | Admit: 2022-06-27 | Discharge: 2022-06-27 | Disposition: A | Discharge status: Home / Self Care | Payer: No Typology Code available for payment source | Attending: Emergency Medicine | Admitting: Emergency Medicine

## 2022-06-27 DIAGNOSIS — M533 Sacrococcygeal disorders, not elsewhere classified: Secondary | ICD-10-CM | POA: Diagnosis not present

## 2022-06-27 DIAGNOSIS — R252 Cramp and spasm: Secondary | ICD-10-CM | POA: Insufficient documentation

## 2022-06-27 LAB — MAGNESIUM: Magnesium: 2 mg/dL (ref 1.7–2.4)

## 2022-06-27 MED ORDER — KETOROLAC TROMETHAMINE 60 MG/2ML IM SOLN
30.0000 mg | Freq: Once | INTRAMUSCULAR | Status: AC
  Administered 2022-06-27: 30 mg via INTRAMUSCULAR

## 2022-06-27 MED ORDER — PREDNISONE 20 MG PO TABS: 40.0000 mg | ORAL_TABLET | Freq: Every day | ORAL | 0 refills | Status: DC

## 2022-06-27 NOTE — ED Provider Notes (Signed)
[facility]    CSN: [id number] Arrival date & time: 06/27/22  1141      History   Chief Complaint Chief Complaint  Patient presents with   Tailbone Pain    HPI Shelly Mitchell is a 71 y.o. female.   Patient presents with constant tailbone pain radiating down the posterior left leg for 7 days.  Pain is described as aching and a cramping to the left calf.  Symptoms are worsened when sitting and lying.  Denies changes in activity endorses that she states primarily throughout the day.  Denies falls, injury, urinary or bowel incontinence, numbness or tingling.  Has attempted use of a muscle relaxer which has been ineffective.    Past Medical History:  Diagnosis Date   Hypercholesterolemia    Stroke [facility]) 2019   No deficits   Tobacco abuse    Wears dentures    full upper    Patient Active Problem List   Diagnosis Date Noted   CVA (cerebral vascular accident) ([place]) 07/27/2018    Past Surgical History:  Procedure Laterality Date   CATARACT EXTRACTION W/PHACO Left 01/28/2022   Procedure: CATARACT EXTRACTION PHACO AND INTRAOCULAR LENS PLACEMENT (IOC) LEFT 3.27 00:33.1;  Surgeon: Eulogio Bear, MD;  Location: [place];  Service: Ophthalmology;  Laterality: Left;   CATARACT EXTRACTION W/PHACO Right 02/11/2022   Procedure: CATARACT EXTRACTION PHACO AND INTRAOCULAR LENS PLACEMENT (IOC) RIGHT 3.97 00:37.5;  Surgeon: Eulogio Bear, MD;  Location: [place];  Service: Ophthalmology;  Laterality: Right;   CESAREAN SECTION     CHOLECYSTECTOMY      OB History   No obstetric history on file.      Home Medications    Prior to Admission medications   Medication Sig Start Date End Date Taking? Authorizing Provider  aspirin EC 81 MG tablet Take 1 tablet (81 mg total) by mouth daily. 07/28/18   Hillary Bow, MD  celecoxib (CELEBREX) 200 MG capsule Take 200 mg by mouth 2 (two) times daily.    [provider]  FLUoxetine (PROZAC) 10  MG tablet Take 10 mg by mouth daily.    [provider]  hydrOXYzine (ATARAX/VISTARIL) 25 MG tablet Take 0.5 -1 tab PO TID PRN anxiety 05/02/21   Laurene Footman B, PA-C  lisinopril (ZESTRIL) 5 MG tablet Take 10 mg by mouth. 09/12/20   [provider]  rosuvastatin (CRESTOR) 20 MG tablet Take by mouth. 07/04/20   [provider]  traZODone (DESYREL) 50 MG tablet Take 0.5 tablet PO at bed time x 3 days and then increase to 1 tab nightly Patient taking differently: 50 mg. 05/02/21 01/28/22  Laurene Footman B, PA-C  atorvastatin (LIPITOR) 40 MG tablet Take 1 tablet (40 mg total) by mouth daily at 6 PM. 07/28/18 02/08/20  Hillary Bow, MD    Family History Family History  Problem Relation Age of Onset   CVA Mother     Social History Social History   Tobacco Use   Smoking status: Former    Types: Cigarettes    Quit date: 07/27/2018    Years since quitting: 3.9   Smokeless tobacco: Never  Vaping Use   Vaping Use: Never used  Substance Use Topics   Alcohol use: No   Drug use: No     Allergies   Patient has no known allergies.   Review of Systems Review of Systems  Constitutional: Negative.   HENT: Negative.    Respiratory: Negative.    Cardiovascular:  Negative.   Musculoskeletal:  Positive for myalgias. Negative for arthralgias, back pain, gait problem, joint swelling, neck pain and neck stiffness.  Skin: Negative.      Physical Exam Triage Vital Signs ED Triage Vitals  Enc Vitals Group     BP 06/27/22 1203 (!) 159/82     Pulse Rate 06/27/22 1203 79     Resp 06/27/22 1203 17     Temp 06/27/22 1203 97.7 F (36.5 C)     Temp Source 06/27/22 1203 Oral     SpO2 06/27/22 1203 96 %     Weight 06/27/22 1158 194 lb (88 kg)     Height 06/27/22 1158 5\' 2"  (1.575 m)     Head Circumference --      Peak Flow --      Pain Score 06/27/22 1158 10     Pain Loc --      Pain Edu? --      Excl. in GC? --    No data found.  Updated Vital Signs BP (!) 159/82 (BP  Location: Left Arm)   Pulse 79   Temp 97.7 F (36.5 C) (Oral)   Resp 17   Ht 5\' 2"  (1.575 m)   Wt 194 lb (88 kg)   SpO2 96%   BMI 35.48 kg/m   Visual Acuity Right Eye Distance:   Left Eye Distance:   Bilateral Distance:    Right Eye Near:   Left Eye Near:    Bilateral Near:     Physical Exam Constitutional:      Appearance: Normal appearance.  HENT:     Head: Normocephalic.  Eyes:     Extraocular Movements: Extraocular movements intact.  Pulmonary:     Effort: Pulmonary effort is normal.  Musculoskeletal:     Comments: Tenderness is directly over the sacrum without ecchymosis, swelling or deformity, no signs of abscess or skin breakdown  Neurological:     Mental Status: She is alert and oriented to person, place, and time. Mental status is at baseline.  Psychiatric:        Mood and Affect: Mood normal.        Behavior: Behavior normal.      UC Treatments / Results  Labs (all labs ordered are listed, but only abnormal results are displayed) Labs Reviewed - No data to display  EKG   Radiology No results found.  Procedures Procedures (including critical care time)  Medications Ordered in UC Medications - No data to display  Initial Impression / Assessment and Plan / UC Course  I have reviewed the triage vital signs and the nursing notes.  Pertinent labs & imaging results that were available during my care of the patient were reviewed by me and considered in my medical decision making (see chart for details).  Pain in sacrum, muscle cramps  We will move forward with treatment today as muscular pain, no signs of infection or skin breakdown over the sacrum, discussed with patient to monitor closely, Toradol injection given in office and prescribed prednisone for outpatient, may continue use of muscle relaxer as needed, magnesium level obtained to assess for cause of muscle cramping, blood work completed on 06/05/2022, all within normal values at the time,  will not repeat, recommended warm compresses to the affected area and rotation of weight as it is a pressure point, given precautions to follow-up with urgent care for any signs of infection or for persisting pain Final Clinical Impressions(s) / UC Diagnoses   Final  diagnoses:  None   Discharge Instructions   None    ED Prescriptions   None    PDMP not reviewed this encounter.   Valinda Hoar, NP 06/27/22 1253

## 2022-06-27 NOTE — ED Triage Notes (Signed)
Pt with one week of low back pain and radiating down left leg.

## 2022-06-27 NOTE — Discharge Instructions (Signed)
On exam pain is directly over your tailbone, at this time your skin is intact and there is no signs of an abscess which is a pocket of infection therefore I believe your pain may be muscular and we will move forward with treatment as such today  Blood work completed on 06/05/2022 was all normal at that time, we have obtained a magnesium level to determine if this is the cause of your cramping, we will notify you of any concerning value and tell you how to move forward, sometimes muscle cramps occur without an actual cause, you may continue supportive care such as heating pad, massage and stretching, you may also try home remedies such as consuming acidic products when cramping is present such as a spoonful of mustard, a sip of pickle juice or vinegar if able to tolerate  You have been given an injection of Toradol here today in the office which helps to reduce inflammation and irritation which ideally will start to alleviate your symptoms in the next 30 minutes to an hour  Starting tomorrow take prednisone every morning with food to continue the process above, while using this medicine you may use Tylenol and muscle relaxer as needed  Do not mix your muscle relaxer with your sleeping medicine as this will make you overly drowsy  Use heat over the affected area in 10 to 15-minute intervals  Sitting rotate position every 1-2 hours, placing pillow underneath the buttocks to to displace weight as your tailbone is a pressure point  Any point if you begin to notice a bump over the tailbone, redness, increased swelling or pain please return for reevaluation  If your pain continues to persist please return for reevaluation

## 2022-06-30 ENCOUNTER — Encounter: Payer: Self-pay | Admitting: Emergency Medicine

## 2022-06-30 ENCOUNTER — Ambulatory Visit
Admission: EM | Admit: 2022-06-30 | Discharge: 2022-06-30 | Disposition: A | Discharge status: Home / Self Care | Payer: No Typology Code available for payment source | Attending: Emergency Medicine | Admitting: Emergency Medicine

## 2022-06-30 DIAGNOSIS — M5432 Sciatica, left side: Secondary | ICD-10-CM | POA: Diagnosis not present

## 2022-06-30 MED ORDER — PREDNISONE 10 MG (21) PO TBPK: ORAL_TABLET | ORAL | 0 refills | Status: DC

## 2022-06-30 MED ORDER — TRAMADOL HCL 50 MG PO TABS: 50.0000 mg | ORAL_TABLET | Freq: Four times a day (QID) | ORAL | 0 refills | Status: DC | PRN

## 2022-06-30 MED ORDER — BACLOFEN 10 MG PO TABS: 10.0000 mg | ORAL_TABLET | Freq: Three times a day (TID) | ORAL | 0 refills | Status: DC

## 2022-06-30 NOTE — ED Triage Notes (Signed)
Patient was seen on 06/27/22 for back pain.  Patient states the medicine is not helping.  Patient states that her pain is same.

## 2022-06-30 NOTE — ED Provider Notes (Signed)
[facility]    CSN: [id number] Arrival date & time: 06/30/22  1354      History   Chief Complaint Chief Complaint  Patient presents with   Back Pain    HPI Shelly Mitchell is a 71 y.o. female.   HPI  71 year old female here for evaluation of back pain.  Patient was seen and evaluated in this urgent care 3 days ago for pain in the left side of her low back that goes diagonally through her buttock and down the outside of her leg down to her toes.  She states that this is been constant for the past 2 weeks and she is not sure what may have brought about.  She denies any falls or injury.  She also denies any weakness in her lower leg.  She has been taking the prednisone, 40 mg daily as prescribed, without any improvement of her symptoms.  She had been using some muscle lectures she had at home, she does not remember the name, but states they were not helping either.  She was not prescribed any home physical therapy.  Past Medical History:  Diagnosis Date   Hypercholesterolemia    Stroke [facility]) 2019   No deficits   Tobacco abuse    Wears dentures    full upper    Patient Active Problem List   Diagnosis Date Noted   CVA (cerebral vascular accident) ([place]) 07/27/2018    Past Surgical History:  Procedure Laterality Date   CATARACT EXTRACTION W/PHACO Left 01/28/2022   Procedure: CATARACT EXTRACTION PHACO AND INTRAOCULAR LENS PLACEMENT (IOC) LEFT 3.27 00:33.1;  Surgeon: Eulogio Bear, MD;  Location: [place];  Service: Ophthalmology;  Laterality: Left;   CATARACT EXTRACTION W/PHACO Right 02/11/2022   Procedure: CATARACT EXTRACTION PHACO AND INTRAOCULAR LENS PLACEMENT (IOC) RIGHT 3.97 00:37.5;  Surgeon: Eulogio Bear, MD;  Location: [place];  Service: Ophthalmology;  Laterality: Right;   CESAREAN SECTION     CHOLECYSTECTOMY      OB History   No obstetric history on file.      Home Medications    Prior to Admission medications    Medication Sig Start Date End Date Taking? Authorizing Provider  baclofen (LIORESAL) 10 MG tablet Take 1 tablet (10 mg total) by mouth 3 (three) times daily. 06/30/22  Yes Margarette Canada, NP  predniSONE (STERAPRED UNI-PAK 21 TAB) 10 MG (21) TBPK tablet Take 6 tablets on day 1, 5 tablets day 2, 4 tablets day 3, 3 tablets day 4, 2 tablets day 5, 1 tablet day 6 06/30/22  Yes Margarette Canada, NP  traMADol (ULTRAM) 50 MG tablet Take 1 tablet (50 mg total) by mouth every 6 (six) hours as needed. 06/30/22  Yes Margarette Canada, NP  aspirin EC 81 MG tablet Take 1 tablet (81 mg total) by mouth daily. 07/28/18   Hillary Bow, MD  celecoxib (CELEBREX) 200 MG capsule Take 200 mg by mouth 2 (two) times daily.    [provider]  FLUoxetine (PROZAC) 10 MG tablet Take 10 mg by mouth daily.    [provider]  hydrOXYzine (ATARAX/VISTARIL) 25 MG tablet Take 0.5 -1 tab PO TID PRN anxiety 05/02/21   Laurene Footman B, PA-C  lisinopril (ZESTRIL) 5 MG tablet Take 10 mg by mouth. 09/12/20   [provider]  rosuvastatin (CRESTOR) 20 MG tablet Take by mouth. 07/04/20   [provider]  traZODone (DESYREL) 50 MG tablet Take 0.5 tablet PO at bed time x  3 days and then increase to 1 tab nightly Patient taking differently: 50 mg. 05/02/21 01/28/22  Laurene Footman B, PA-C  atorvastatin (LIPITOR) 40 MG tablet Take 1 tablet (40 mg total) by mouth daily at 6 PM. 07/28/18 02/08/20  Hillary Bow, MD    Family History Family History  Problem Relation Age of Onset   CVA Mother     Social History Social History   Tobacco Use   Smoking status: Former    Types: Cigarettes    Quit date: 07/27/2018    Years since quitting: 3.9   Smokeless tobacco: Never  Vaping Use   Vaping Use: Never used  Substance Use Topics   Alcohol use: No   Drug use: No     Allergies   Patient has no known allergies.   Review of Systems Review of Systems  Musculoskeletal:  Positive for back pain. Negative for arthralgias  and myalgias.  Neurological:  Positive for numbness. Negative for weakness.     Physical Exam Triage Vital Signs ED Triage Vitals  Enc Vitals Group     BP 06/30/22 1437 (!) 201/86     Pulse Rate 06/30/22 1437 71     Resp 06/30/22 1437 15     Temp 06/30/22 1437 98.1 F (36.7 C)     Temp Source 06/30/22 1437 Oral     SpO2 06/30/22 1437 95 %     Weight 06/30/22 1436 194 lb 0.1 oz (88 kg)     Height 06/30/22 1436 5\' 2"  (1.575 m)     Head Circumference --      Peak Flow --      Pain Score 06/30/22 1435 10     Pain Loc --      Pain Edu? --      Excl. in [place]? --    No data found.  Updated Vital Signs BP (!) 201/86 (BP Location: Right Arm)   Pulse 71   Temp 98.1 F (36.7 C) (Oral)   Resp 15   Ht 5\' 2"  (1.575 m)   Wt 194 lb 0.1 oz (88 kg)   SpO2 95%   BMI 35.48 kg/m   Visual Acuity Right Eye Distance:   Left Eye Distance:   Bilateral Distance:    Right Eye Near:   Left Eye Near:    Bilateral Near:     Physical Exam Vitals and nursing note reviewed.  Constitutional:      Appearance: Normal appearance.  HENT:     Head: Normocephalic and atraumatic.  Musculoskeletal:        General: Tenderness present. No signs of injury.  Skin:    General: Skin is warm and dry.     Capillary Refill: Capillary refill takes less than 2 seconds.  Neurological:     General: No focal deficit present.     Mental Status: She is alert and oriented to person, place, and time.  Psychiatric:        Mood and Affect: Mood normal.        Behavior: Behavior normal.        Thought Content: Thought content normal.        Judgment: Judgment normal.      UC Treatments / Results  Labs (all labs ordered are listed, but only abnormal results are displayed) Labs Reviewed - No data to display  EKG   Radiology No results found.  Procedures Procedures (including critical care time)  Medications Ordered in UC Medications - No data to display  Initial Impression / Assessment and Plan /  UC Course  I have reviewed the triage vital signs and the nursing notes.  Pertinent labs & imaging results that were available during my care of the patient were reviewed by me and considered in my medical decision making (see chart for details).   Patient is a pleasant, nontoxic-appearing 71 year old female here for evaluation of pain in her left buttock that travels down the posterior lateral aspect of her left leg to her foot is been present for last 2 weeks.  On exam she has no midline spinous process tenderness or step-off in the lumbar spine and no lumbar paraspinous tenderness.  She does have tenderness with palpation along the path of the her sciatic nerve through her left buttock and down the outside of her left leg.  Her bilateral lower extremity strength is 5/5.  Her sensation is intact.  She does have a positive straight leg raise on the left.  Her exam is consistent with sciatica.  I we will discharge her home on a higher dose prednisone taper and add on baclofen that she can take 3 times a day to help with muscle inflammation.  I will also prescribe a short prescription of tramadol that she can use at bedtime as she states the pain is interfering with her sleep.  I did advise the patient that if her pain does not improve, or worsens, that she needs to see orthopedics and she may need more targeted therapy that we cannot provide here in the urgent care.   Final Clinical Impressions(s) / UC Diagnoses   Final diagnoses:  Sciatica of left side     Discharge Instructions      Take the prednisone according to the package instructions.  You will take it each morning with breakfast.  Make sure that you are always taking it with food.  Take the baclofen every 8 hours as needed for muscle spasm.  You may use the tramadol at night as needed for pain and sleep.  Follow the rehabilitation exercises in your discharge paperwork to help you with your sciatic pain.  Return for reevaluation for  new or worsening symptoms, or see your primary care provider.      ED Prescriptions     Medication Sig Dispense Auth. Provider   predniSONE (STERAPRED UNI-PAK 21 TAB) 10 MG (21) TBPK tablet Take 6 tablets on day 1, 5 tablets day 2, 4 tablets day 3, 3 tablets day 4, 2 tablets day 5, 1 tablet day 6 21 tablet Margarette Canada, NP   baclofen (LIORESAL) 10 MG tablet Take 1 tablet (10 mg total) by mouth 3 (three) times daily. 30 each Margarette Canada, NP   traMADol (ULTRAM) 50 MG tablet Take 1 tablet (50 mg total) by mouth every 6 (six) hours as needed. 15 tablet Margarette Canada, NP      I have reviewed the PDMP during this encounter.   Margarette Canada, NP 06/30/22 1517

## 2022-06-30 NOTE — Discharge Instructions (Addendum)
Take the prednisone according to the package instructions.  You will take it each morning with breakfast.  Make sure that you are always taking it with food.  Take the baclofen every 8 hours as needed for muscle spasm.  You may use the tramadol at night as needed for pain and sleep.  Follow the rehabilitation exercises in your discharge paperwork to help you with your sciatic pain.  Return for reevaluation for new or worsening symptoms, or see your primary care provider.

## 2022-07-02 DIAGNOSIS — I10 Essential (primary) hypertension: Secondary | ICD-10-CM | POA: Diagnosis not present

## 2022-07-02 DIAGNOSIS — M545 Low back pain, unspecified: Secondary | ICD-10-CM | POA: Diagnosis not present

## 2022-07-02 DIAGNOSIS — E785 Hyperlipidemia, unspecified: Secondary | ICD-10-CM | POA: Diagnosis not present

## 2022-07-02 DIAGNOSIS — M5416 Radiculopathy, lumbar region: Secondary | ICD-10-CM | POA: Diagnosis not present

## 2022-07-12 DIAGNOSIS — E785 Hyperlipidemia, unspecified: Secondary | ICD-10-CM | POA: Diagnosis not present

## 2022-07-12 DIAGNOSIS — F411 Generalized anxiety disorder: Secondary | ICD-10-CM | POA: Diagnosis not present

## 2022-07-12 DIAGNOSIS — I10 Essential (primary) hypertension: Secondary | ICD-10-CM | POA: Diagnosis not present

## 2022-07-16 ENCOUNTER — Emergency Department: Payer: No Typology Code available for payment source

## 2022-07-16 ENCOUNTER — Observation Stay
Admission: EM | Admit: 2022-07-16 | Discharge: 2022-07-17 | Disposition: A | Discharge status: Home / Self Care | Payer: No Typology Code available for payment source | Attending: Internal Medicine | Admitting: Internal Medicine

## 2022-07-16 DIAGNOSIS — Z1152 Encounter for screening for COVID-19: Secondary | ICD-10-CM | POA: Insufficient documentation

## 2022-07-16 DIAGNOSIS — I959 Hypotension, unspecified: Secondary | ICD-10-CM | POA: Diagnosis not present

## 2022-07-16 DIAGNOSIS — E876 Hypokalemia: Secondary | ICD-10-CM | POA: Insufficient documentation

## 2022-07-16 DIAGNOSIS — J189 Pneumonia, unspecified organism: Secondary | ICD-10-CM | POA: Diagnosis not present

## 2022-07-16 DIAGNOSIS — R0902 Hypoxemia: Secondary | ICD-10-CM | POA: Diagnosis not present

## 2022-07-16 DIAGNOSIS — E782 Mixed hyperlipidemia: Secondary | ICD-10-CM | POA: Diagnosis not present

## 2022-07-16 DIAGNOSIS — Z8673 Personal history of transient ischemic attack (TIA), and cerebral infarction without residual deficits: Secondary | ICD-10-CM | POA: Diagnosis not present

## 2022-07-16 DIAGNOSIS — D72829 Elevated white blood cell count, unspecified: Secondary | ICD-10-CM | POA: Diagnosis not present

## 2022-07-16 DIAGNOSIS — F419 Anxiety disorder, unspecified: Secondary | ICD-10-CM | POA: Insufficient documentation

## 2022-07-16 DIAGNOSIS — Z79899 Other long term (current) drug therapy: Secondary | ICD-10-CM | POA: Insufficient documentation

## 2022-07-16 DIAGNOSIS — I10 Essential (primary) hypertension: Secondary | ICD-10-CM | POA: Insufficient documentation

## 2022-07-16 DIAGNOSIS — R339 Retention of urine, unspecified: Secondary | ICD-10-CM | POA: Diagnosis not present

## 2022-07-16 DIAGNOSIS — Z87891 Personal history of nicotine dependence: Secondary | ICD-10-CM | POA: Diagnosis not present

## 2022-07-16 DIAGNOSIS — Z7982 Long term (current) use of aspirin: Secondary | ICD-10-CM | POA: Diagnosis not present

## 2022-07-16 DIAGNOSIS — R404 Transient alteration of awareness: Secondary | ICD-10-CM | POA: Diagnosis not present

## 2022-07-16 DIAGNOSIS — R9431 Abnormal electrocardiogram [ECG] [EKG]: Secondary | ICD-10-CM | POA: Diagnosis not present

## 2022-07-16 DIAGNOSIS — G9341 Metabolic encephalopathy: Principal | ICD-10-CM | POA: Insufficient documentation

## 2022-07-16 DIAGNOSIS — J9811 Atelectasis: Secondary | ICD-10-CM | POA: Diagnosis not present

## 2022-07-16 DIAGNOSIS — R338 Other retention of urine: Secondary | ICD-10-CM

## 2022-07-16 DIAGNOSIS — E785 Hyperlipidemia, unspecified: Secondary | ICD-10-CM | POA: Insufficient documentation

## 2022-07-16 DIAGNOSIS — R4182 Altered mental status, unspecified: Secondary | ICD-10-CM

## 2022-07-16 LAB — COMPREHENSIVE METABOLIC PANEL
ALT: 20 U/L (ref 0–44)
AST: 24 U/L (ref 15–41)
Albumin: 3.7 g/dL (ref 3.5–5.0)
Alkaline Phosphatase: 80 U/L (ref 38–126)
Anion gap: 10 (ref 5–15)
BUN: 29 mg/dL — ABNORMAL HIGH (ref 8–23)
CO2: 24 mmol/L (ref 22–32)
Calcium: 8.7 mg/dL — ABNORMAL LOW (ref 8.9–10.3)
Chloride: 111 mmol/L (ref 98–111)
Creatinine, Ser: 0.83 mg/dL (ref 0.44–1.00)
GFR, Estimated: >60 mL/min (ref >60)
Glucose, Bld: 125 mg/dL — ABNORMAL HIGH (ref 70–99)
Potassium: 3.1 mmol/L — ABNORMAL LOW (ref 3.5–5.1)
Sodium: 145 mmol/L (ref 135–145)
Total Bilirubin: 1 mg/dL (ref 0.3–1.2)
Total Protein: 6.8 g/dL (ref 6.5–8.1)

## 2022-07-16 LAB — CBC WITH DIFFERENTIAL/PLATELET
Abs Immature Granulocytes: 0.04 10*3/uL (ref 0.00–0.07)
Basophils Absolute: 0.1 10*3/uL (ref 0.0–0.1)
Basophils Relative: 1 %
Eosinophils Absolute: 0.2 10*3/uL (ref 0.0–0.5)
Eosinophils Relative: 1 %
HCT: 42 % (ref 36.0–46.0)
Hemoglobin: 13.7 g/dL (ref 12.0–15.0)
Immature Granulocytes: 0 %
Lymphocytes Relative: 17 %
Lymphs Abs: 2.2 10*3/uL (ref 0.7–4.0)
MCH: 30 pg (ref 26.0–34.0)
MCHC: 32.6 g/dL (ref 30.0–36.0)
MCV: 91.9 fL (ref 80.0–100.0)
Monocytes Absolute: 1.4 10*3/uL — ABNORMAL HIGH (ref 0.1–1.0)
Monocytes Relative: 11 %
Neutro Abs: 9.4 10*3/uL — ABNORMAL HIGH (ref 1.7–7.7)
Neutrophils Relative %: 70 %
Platelets: 310 10*3/uL (ref 150–400)
RBC: 4.57 MIL/uL (ref 3.87–5.11)
RDW: 13.4 % (ref 11.5–15.5)
WBC: 13.4 10*3/uL — ABNORMAL HIGH (ref 4.0–10.5)
nRBC: 0 % (ref 0.0–0.2)

## 2022-07-16 LAB — URINALYSIS, ROUTINE W REFLEX MICROSCOPIC
Bacteria, UA: NONE SEEN
Bilirubin Urine: NEGATIVE
Glucose, UA: 50 mg/dL — AB
Ketones, ur: 5 mg/dL — AB
Leukocytes,Ua: NEGATIVE
Nitrite: NEGATIVE
Protein, ur: >=300 mg/dL — AB
Specific Gravity, Urine: 1.02 (ref 1.005–1.030)
pH: 5 (ref 5.0–8.0)

## 2022-07-16 LAB — CK: Total CK: 358 U/L — ABNORMAL HIGH (ref 38–234)

## 2022-07-16 LAB — URINE DRUG SCREEN, QUALITATIVE (ARMC ONLY)
Amphetamines, Ur Screen: NOT DETECTED
Barbiturates, Ur Screen: NOT DETECTED
Benzodiazepine, Ur Scrn: NOT DETECTED
Cannabinoid 50 Ng, Ur ~~LOC~~: NOT DETECTED
Cocaine Metabolite,Ur ~~LOC~~: NOT DETECTED
MDMA (Ecstasy)Ur Screen: NOT DETECTED
Methadone Scn, Ur: NOT DETECTED
Opiate, Ur Screen: NOT DETECTED
Phencyclidine (PCP) Ur S: NOT DETECTED
Tricyclic, Ur Screen: NOT DETECTED

## 2022-07-16 LAB — ACETAMINOPHEN LEVEL: Acetaminophen (Tylenol), Serum: <10 ug/mL — ABNORMAL LOW (ref 10–30)

## 2022-07-16 LAB — SALICYLATE LEVEL: Salicylate Lvl: <7 mg/dL — ABNORMAL LOW (ref 7.0–30.0)

## 2022-07-16 LAB — CBG MONITORING, ED: Glucose-Capillary: 104 mg/dL — ABNORMAL HIGH (ref 70–99)

## 2022-07-16 LAB — ETHANOL: Alcohol, Ethyl (B): <10 mg/dL (ref <10)

## 2022-07-16 LAB — AMMONIA: Ammonia: 12 umol/L (ref 9–35)

## 2022-07-16 LAB — PROCALCITONIN: Procalcitonin: <0.1 ng/mL

## 2022-07-16 LAB — SARS CORONAVIRUS 2 BY RT PCR: SARS Coronavirus 2 by RT PCR: NEGATIVE

## 2022-07-16 MED ORDER — LISINOPRIL 10 MG PO TABS
10.0000 mg | ORAL_TABLET | Freq: Every day | ORAL | Status: DC
  Administered 2022-07-17: 10 mg via ORAL
  Filled 2022-07-16: qty 1

## 2022-07-16 MED ORDER — OXYCODONE HCL 5 MG PO TABS: 5.0000 mg | ORAL_TABLET | ORAL | Status: DC | PRN

## 2022-07-16 MED ORDER — ROSUVASTATIN CALCIUM 10 MG PO TABS
20.0000 mg | ORAL_TABLET | Freq: Every day | ORAL | Status: DC
  Administered 2022-07-17: 20 mg via ORAL
  Filled 2022-07-16: qty 2

## 2022-07-16 MED ORDER — SODIUM CHLORIDE 0.9 % IV SOLN: INTRAVENOUS | Status: DC

## 2022-07-16 MED ORDER — POTASSIUM CHLORIDE CRYS ER 20 MEQ PO TBCR
40.0000 meq | EXTENDED_RELEASE_TABLET | Freq: Once | ORAL | Status: AC
  Administered 2022-07-16: 40 meq via ORAL
  Filled 2022-07-16: qty 2

## 2022-07-16 MED ORDER — ONDANSETRON HCL 4 MG PO TABS: 4.0000 mg | ORAL_TABLET | Freq: Four times a day (QID) | ORAL | Status: DC | PRN

## 2022-07-16 MED ORDER — HYDRALAZINE HCL 20 MG/ML IJ SOLN
5.0000 mg | Freq: Once | INTRAMUSCULAR | Status: AC
  Administered 2022-07-17: 5 mg via INTRAVENOUS
  Filled 2022-07-16: qty 1

## 2022-07-16 MED ORDER — ACETAMINOPHEN 650 MG RE SUPP: 650.0000 mg | Freq: Four times a day (QID) | RECTAL | Status: DC | PRN

## 2022-07-16 MED ORDER — HYDROXYZINE HCL 50 MG PO TABS: 50.0000 mg | ORAL_TABLET | Freq: Three times a day (TID) | ORAL | Status: DC | PRN

## 2022-07-16 MED ORDER — HALOPERIDOL LACTATE 5 MG/ML IJ SOLN: 2.0000 mg | Freq: Once | INTRAMUSCULAR | Status: DC | PRN

## 2022-07-16 MED ORDER — ONDANSETRON HCL 4 MG/2ML IJ SOLN: 4.0000 mg | Freq: Four times a day (QID) | INTRAMUSCULAR | Status: DC | PRN

## 2022-07-16 MED ORDER — HEPARIN SODIUM (PORCINE) 5000 UNIT/ML IJ SOLN
5000.0000 [IU] | Freq: Three times a day (TID) | INTRAMUSCULAR | Status: DC
  Administered 2022-07-17: 5000 [IU] via SUBCUTANEOUS
  Filled 2022-07-16 (×2): qty 1

## 2022-07-16 MED ORDER — ASPIRIN 81 MG PO TBEC
81.0000 mg | DELAYED_RELEASE_TABLET | Freq: Every day | ORAL | Status: DC
  Administered 2022-07-17: 81 mg via ORAL
  Filled 2022-07-16: qty 1

## 2022-07-16 MED ORDER — ACETAMINOPHEN 325 MG PO TABS: 650.0000 mg | ORAL_TABLET | Freq: Four times a day (QID) | ORAL | Status: DC | PRN

## 2022-07-16 MED ORDER — MORPHINE SULFATE (PF) 2 MG/ML IV SOLN: 2.0000 mg | INTRAVENOUS | Status: DC | PRN

## 2022-07-16 NOTE — ED Triage Notes (Addendum)
Pt arrived via [facility] with c/o of altered mental status and potential UTI. Pt found by EMS in bedroom with feces in both hands. Pt seen 2 days ago for sciatic & LL leg pain and UTI. A&O to self only, no obvious neuro deficits. Per EMS [facility] yesterday (07/15/22), time unknown.  CGB 152 98.49F 152/100 77 NSR 24RR  18g RAC

## 2022-07-16 NOTE — Assessment & Plan Note (Signed)
-   Further increases suspicion of UTI - Urine culture pending - No saddle anesthesia - No midline back pain - Straight cath as needed

## 2022-07-16 NOTE — H&P (Signed)
History and Physical    Patient: Shelly Mitchell:[id number] DOB: 1950/10/30 DOA: 07/16/2022 DOS: the patient was seen and examined on 07/16/2022 PCP: Marina Goodell, MD  Patient coming from: Home  Chief Complaint:  Chief Complaint  Patient presents with   Altered Mental Status   HPI: Shelly Mitchell is a 71 y.o. female with medical history significant of hyperlipidemia, prior stroke, former smoker, hypertension, and more presents the ED with a chief complaint of altered mental status.  Patient has been complaining of sciatic nerve pain on the left side for 4 days.  For the past 2 days she has been laying in bed all day, which is not normal for her, but she is still incoherent.  Last night her daughter saw her and she was coherent.  This morning when her daughter checked on her she was covered in urine and feces, reportedly playing with feces, and totally confused.  This patient is independent with ADLs, normally quite functional.  Daughters report that there is no chance that patient has not taken Benadryl to try to go to sleep, had taken any benzos that she might have found access to, or overdosed on any of her medications.  Patient reports that the left-sided sciatic nerve pain is currently gone.  Patient is able to say her full name, she reports that were in urgent care which is pretty close to an ER, but cannot name the city or state.  She does know what year it is and what year she was born.  She and family both reports she has not had any cough or dyspnea.  She has not had any dysuria per her report.  Patient did have urinary retention on first coming to the ED with 1500 cc out by straight cath.  Patient reports that she started having decreased urine output yesterday, but the accuracy of her history is questionable when she is so disoriented.  She reports no dysuria.  Patient has no other complaints at this time.  Family reports she has been eating normally, and has otherwise been  her normal self aside from this left-sided sciatic pain, and now today's confusion.  Patient no longer smokes, does not drink alcohol, does not use illicit drugs.  Family is not sure if she got vaccinated for COVID.  Patient is full code. Review of Systems: As mentioned in the history of present illness. All other systems reviewed and are negative. Past Medical History:  Diagnosis Date   Hypercholesterolemia    Stroke ([facility]) 2019   No deficits   Tobacco abuse    Wears dentures    full upper   Past Surgical History:  Procedure Laterality Date   CATARACT EXTRACTION W/PHACO Left 01/28/2022   Procedure: CATARACT EXTRACTION PHACO AND INTRAOCULAR LENS PLACEMENT (IOC) LEFT 3.27 00:33.1;  Surgeon: Nevada Crane, MD;  [facility];  Service: Ophthalmology;  Laterality: Left;   CATARACT EXTRACTION W/PHACO Right 02/11/2022   Procedure: CATARACT EXTRACTION PHACO AND INTRAOCULAR LENS PLACEMENT (IOC) RIGHT 3.97 00:37.5;  Surgeon: Nevada Crane, MD;  Location: [facility];  Service: Ophthalmology;  Laterality: Right;   CESAREAN SECTION     CHOLECYSTECTOMY     Social History:  reports that she quit smoking about 3 years ago. Her smoking use included cigarettes. She has never used smokeless tobacco. She reports that she does not drink alcohol and does not use drugs.  No Known Allergies  Family History  Problem Relation Age of Onset  CVA Mother     Prior to Admission medications   Medication Sig Start Date End Date Taking? Authorizing Provider  aspirin EC 81 MG tablet Take 1 tablet (81 mg total) by mouth daily. 07/28/18   Hillary Bow, MD  baclofen (LIORESAL) 10 MG tablet Take 1 tablet (10 mg total) by mouth 3 (three) times daily. 06/30/22   Margarette Canada, NP  celecoxib (CELEBREX) 200 MG capsule Take 200 mg by mouth 2 (two) times daily.    [provider]  FLUoxetine (PROZAC) 10 MG tablet Take 10 mg by mouth daily.    [provider]  hydrOXYzine  (ATARAX/VISTARIL) 25 MG tablet Take 0.5 -1 tab PO TID PRN anxiety 05/02/21   Laurene Footman B, PA-C  lisinopril (ZESTRIL) 5 MG tablet Take 10 mg by mouth. 09/12/20   [provider]  predniSONE (STERAPRED UNI-PAK 21 TAB) 10 MG (21) TBPK tablet Take 6 tablets on day 1, 5 tablets day 2, 4 tablets day 3, 3 tablets day 4, 2 tablets day 5, 1 tablet day 6 06/30/22   Margarette Canada, NP  rosuvastatin (CRESTOR) 20 MG tablet Take by mouth. 07/04/20   [provider]  traMADol (ULTRAM) 50 MG tablet Take 1 tablet (50 mg total) by mouth every 6 (six) hours as needed. 06/30/22   Margarette Canada, NP  traZODone (DESYREL) 50 MG tablet Take 0.5 tablet PO at bed time x 3 days and then increase to 1 tab nightly Patient taking differently: 50 mg. 05/02/21 01/28/22  Laurene Footman B, PA-C  atorvastatin (LIPITOR) 40 MG tablet Take 1 tablet (40 mg total) by mouth daily at 6 PM. 07/28/18 02/08/20  Hillary Bow, MD    Physical Exam: Vitals:   07/16/22 1847 07/16/22 1900 07/16/22 2015 07/16/22 2100  BP: (!) 209/182 (!) 185/69 (!) 170/78 (!) 162/72  Pulse: (!) 55 73 74 71  Resp: 20  18 18   Temp: 98.8 F (37.1 C)     TempSrc: Oral     SpO2: 100% 98% 97% 98%   1.  General: Patient lying supine in bed,  no acute distress   2. Psychiatric: Alert and oriented x 2, mood and behavior normal for situation, pleasant and cooperative with exam   3. Neurologic: Speech is delayed but clear and language are normal, face is symmetric, moves all 4 extremities voluntarily, at baseline without acute deficits on limited exam   4. HEENMT:  Head is atraumatic, normocephalic, pupils reactive to light, neck is supple, trachea is midline, thyromegaly present, mucous membranes are moist   5. Respiratory : Lungs are clear to auscultation bilaterally without wheezing, rhonchi, rales, no cyanosis, no increase in work of breathing or accessory muscle use   6. Cardiovascular : Heart rate normal, rhythm is regular, no murmurs, rubs or  gallops, no peripheral edema, peripheral pulses palpated   7. Gastrointestinal:  Abdomen is soft, nondistended, nontender to palpation bowel sounds active, no masses or organomegaly palpated   8. Skin:  Skin is warm, dry and intact without rashes, acute lesions, or ulcers on limited exam   9.Musculoskeletal:  No acute deformities or trauma, no asymmetry in tone, no peripheral edema, peripheral pulses palpated, no tenderness to palpation in the extremities  Data Reviewed: In the ED Temp 98.8, heart rate 55-74, respiratory rate 18-20, blood pressure 162/69-209/182, satting 97-100% Leukocytosis at 13.4, hemoglobin 13.7, platelets 310 Chemistry reveals a hypokalemia at 3.1 COVID-negative UA shows ketones, hemoglobin, and glucose UDS is negative CT head shows no acute abnormality 1500  cc out via straight cath Admission requested for altered mental status  Assessment and Plan: * Acute metabolic encephalopathy - Described as confusion - Possibly a polypharmacy component with SSRI, Atarax, baclofen, tramadol, and possibly prednisone-on med list but family is unaware if patient is taking it-all on board. - Hold Prozac, baclofen, tramadol, prednisone - Check for nutritional deficiencies including 123456, B6, B1, folic acid - Check TSH, patient is currently euglycemic - UDS negative - Check alcohol level, Tylenol level, salicylate level - Check ammonia - CT head shows no acute abnormality - Patient did have some urinary retention, but UA was not indicative of UTI - Check urine culture - Chest x-ray shows streaky left basilar opacity typical of atelectasis, elevation of the right hemidiaphragm - Continue to monitor  Acute urinary retention - Further increases suspicion of UTI - Urine culture pending - No saddle anesthesia - No midline back pain - Straight cath as needed  Anxiety Holding Prozac at this time due to altered mental status.  Continue Atarax as needed  Hyperlipidemia -  Continue Crestor  Essential hypertension - Continue lisinopril  Hypokalemia - Replaced in the ED with 40 mEq of potassium - Check potassium as well as mag in the a.m.  Leukocytosis - Leukocytosis at 13.4 - UA not indicative of UTI - Chest x-ray is not indicative of pneumonia - Check a procalcitonin, if positive cover with antibiotics while locating the source      Advance Care Planning:   Code Status: Prior full  Consults: None  Family Communication: Daughters and granddaughter at bedside  Severity of Illness: The appropriate patient status for this patient is OBSERVATION. Observation status is judged to be reasonable and necessary in order to provide the required intensity of service to ensure the patient's safety. The patient's presenting symptoms, physical exam findings, and initial radiographic and laboratory data in the context of their medical condition is felt to place them at decreased risk for further clinical deterioration. Furthermore, it is anticipated that the patient will be medically stable for discharge from the hospital within 2 midnights of admission.   Author: Rolla Plate, DO 07/16/2022 10:33 PM  For on call review [email or web address].

## 2022-07-16 NOTE — Assessment & Plan Note (Signed)
-   Described as confusion - Possibly a polypharmacy component with SSRI, Atarax, baclofen, tramadol, and possibly prednisone-on med list but family is unaware if patient is taking it-all on board. - Hold Prozac, baclofen, tramadol, prednisone - Check for nutritional deficiencies including B12, B6, B1, folic acid - Check TSH, patient is currently euglycemic - UDS negative - Check alcohol level, Tylenol level, salicylate level - Check ammonia - CT head shows no acute abnormality - Patient did have some urinary retention, but UA was not indicative of UTI - Check urine culture - Chest x-ray shows streaky left basilar opacity typical of atelectasis, elevation of the right hemidiaphragm - Continue to monitor

## 2022-07-16 NOTE — Assessment & Plan Note (Signed)
Holding Prozac at this time due to altered mental status.  Continue Atarax as needed

## 2022-07-16 NOTE — Assessment & Plan Note (Signed)
-   Leukocytosis at 13.4 - UA not indicative of UTI - Chest x-ray is not indicative of pneumonia - Check a procalcitonin, if positive cover with antibiotics while locating the source

## 2022-07-16 NOTE — ED Provider Notes (Signed)
[facility] Provider Note    Event Date/Time   First MD Initiated Contact with Patient 07/16/22 1856     (approximate)   History   Altered Mental Status   HPI  SHARREN SCHNURR is a 71 y.o. female past medical history of hyperlipidemia, CVA who presents with altered mental status.  Patient's daughter tells me that she has been complaining of some back pain with radiation down the left leg.  She was seen on 11 2 and then 11 5 and diagnosed with sciatica.  Patient has been complaining of some back pain over the weekend that was okay yesterday.  Today her daughter then found her in her bedroom and she had urinated on herself and was covered in feces and was very confused.  She is not aware of any other symptoms other than the leg and back pain.  The patient to me complains of left leg pain but she denies back pain denies any bowel or bladder incontinence at home denies abdominal pain fevers chills nausea vomiting or headache.  I see that patient was seen in the ED on 11 5 and was started on a higher prednisone taper and was given baclofen.  Patient then had refill of tramadol called in by primary care which I see via telephone note.  Patient's daughter has her medications with her.  There is baclofen, Flexeril, gabapentin and celecoxib for pain.     Past Medical History:  Diagnosis Date   Hypercholesterolemia    Stroke (HCC) 2019   No deficits   Tobacco abuse    Wears dentures    full upper    Patient Active Problem List   Diagnosis Date Noted   CVA (cerebral vascular accident) (HCC) 07/27/2018     Physical Exam  Triage Vital Signs: ED Triage Vitals [07/16/22 1847]  Enc Vitals Group     BP (!) 209/182     Pulse Rate (!) 55     Resp 20     Temp 98.8 F (37.1 C)     Temp Source Oral     SpO2 100 %     Weight      Height      Head Circumference      Peak Flow      Pain Score      Pain Loc      Pain Edu?      Excl. in GC?     Most recent  vital signs: Vitals:   07/16/22 1847 07/16/22 1900  BP: (!) 209/182 (!) 185/69  Pulse: (!) 55 73  Resp: 20   Temp: 98.8 F (37.1 C)   SpO2: 100% 98%     General: Awake, no distress.  CV:  Good peripheral perfusion.  Resp:  Normal effort.  Abd:  No distention.  Abdomen is soft nontender throughout Neuro:             Awake, Alert, Oriented x 1 Other:  Patient is confused but she is awake and follows commands  5 out of 5 strength with plantarflexion dorsiflexion and hip flexion bilaterally ED Results / Procedures / Treatments  Labs (all labs ordered are listed, but only abnormal results are displayed) Labs Reviewed  COMPREHENSIVE METABOLIC PANEL - Abnormal; Notable for the following components:      Result Value   Potassium 3.1 (*)    Glucose, Bld 125 (*)    BUN 29 (*)    Calcium 8.7 (*)    All  other components within normal limits  CBC WITH DIFFERENTIAL/PLATELET - Abnormal; Notable for the following components:   WBC 13.4 (*)    Neutro Abs 9.4 (*)    Monocytes Absolute 1.4 (*)    All other components within normal limits  URINALYSIS, ROUTINE W REFLEX MICROSCOPIC - Abnormal; Notable for the following components:   Color, Urine YELLOW (*)    APPearance CLEAR (*)    Glucose, UA 50 (*)    Hgb urine dipstick SMALL (*)    Ketones, ur 5 (*)    Protein, ur >=300 (*)    All other components within normal limits  CBG MONITORING, ED - Abnormal; Notable for the following components:   Glucose-Capillary 104 (*)    All other components within normal limits  SARS CORONAVIRUS 2 BY RT PCR  URINE DRUG SCREEN, QUALITATIVE ([facility] ONLY)     EKG  EKG reviewed interpreted by myself shows subtle ST depression in the precordial leads   RADIOLOGY I reviewed and interpreted the CT scan of the brain which does not show any acute intracranial process    PROCEDURES:  Critical Care performed: Yes, see critical care procedure note(s)  Procedures  The patient is on the cardiac monitor  to evaluate for evidence of arrhythmia and/or significant heart rate changes.   MEDICATIONS ORDERED IN ED: Medications  potassium chloride SA (KLOR-CON M) CR tablet 40 mEq (has no administration in time range)     IMPRESSION / MDM / ASSESSMENT AND PLAN / ED COURSE  I reviewed the triage vital signs and the nursing notes.                              Patient's presentation is most consistent with acute presentation with potential threat to life or bodily function.  Differential diagnosis includes, but is not limited to, metabolic encephalopathy, infectious process such as UTI, pneumonia, intracranial hemorrhage, CVA, polypharmacy  The patient is a 71 year old female with no underlying dementia presenting with altered mental status.  Daughter notes that she has been complaining of sciatica and has been to the ED multiple times and has been in contact with her primary doctor who is referring her to physiatry.  Today patient was acutely altered and was playing with her feces.  Patient is oriented x 1 for me she is encephalopathic but does follow commands.  She is complaining of left leg pain.  On exam she does have good strength with plantarflexion and dorsiflexion of the lower extremities and sensation is intact.  Her abdomen is soft.  The rest of her neurologic exam is nonfocal.  Labs are overall reassuring.  She does have a leukocytosis of 13 CMP with potassium 3.1 which was supplemented orally.  UA is not consistent with infection.  UDS is negative.  Chest x-ray and CT head are nonrevealing.  Ultimately I am suspicious that her altered mental status is due to polypharmacy.  Will consult the hospitalist as she is not near her baseline.       FINAL CLINICAL IMPRESSION(S) / ED DIAGNOSES   Final diagnoses:  Altered mental status, unspecified altered mental status type     Rx / DC Orders   ED Discharge Orders     None        Note:  This document was prepared using Dragon voice  recognition software and may include unintentional dictation errors.   Georga Hacking, MD 07/16/22 2003

## 2022-07-16 NOTE — Assessment & Plan Note (Signed)
Continue lisinopril

## 2022-07-16 NOTE — Assessment & Plan Note (Signed)
-   Replaced in the ED with 40 mEq of potassium - Check potassium as well as mag in the a.m.

## 2022-07-16 NOTE — Assessment & Plan Note (Signed)
Continue Crestor 

## 2022-07-17 ENCOUNTER — Other Ambulatory Visit: Payer: Self-pay

## 2022-07-17 ENCOUNTER — Encounter: Payer: Self-pay | Admitting: Family Medicine

## 2022-07-17 DIAGNOSIS — G9341 Metabolic encephalopathy: Secondary | ICD-10-CM | POA: Diagnosis not present

## 2022-07-17 LAB — CBC WITH DIFFERENTIAL/PLATELET
Abs Immature Granulocytes: 0.04 10*3/uL (ref 0.00–0.07)
Basophils Absolute: 0.1 10*3/uL (ref 0.0–0.1)
Basophils Relative: 1 %
Eosinophils Absolute: 0.4 10*3/uL (ref 0.0–0.5)
Eosinophils Relative: 4 %
HCT: 40.2 % (ref 36.0–46.0)
Hemoglobin: 13 g/dL (ref 12.0–15.0)
Immature Granulocytes: 0 %
Lymphocytes Relative: 28 %
Lymphs Abs: 3.1 10*3/uL (ref 0.7–4.0)
MCH: 30.1 pg (ref 26.0–34.0)
MCHC: 32.3 g/dL (ref 30.0–36.0)
MCV: 93.1 fL (ref 80.0–100.0)
Monocytes Absolute: 1.2 10*3/uL — ABNORMAL HIGH (ref 0.1–1.0)
Monocytes Relative: 11 %
Neutro Abs: 6.1 10*3/uL (ref 1.7–7.7)
Neutrophils Relative %: 56 %
Platelets: 294 10*3/uL (ref 150–400)
RBC: 4.32 MIL/uL (ref 3.87–5.11)
RDW: 13.7 % (ref 11.5–15.5)
WBC: 10.8 10*3/uL — ABNORMAL HIGH (ref 4.0–10.5)
nRBC: 0 % (ref 0.0–0.2)

## 2022-07-17 LAB — COMPREHENSIVE METABOLIC PANEL
ALT: 19 U/L (ref 0–44)
AST: 20 U/L (ref 15–41)
Albumin: 3.5 g/dL (ref 3.5–5.0)
Alkaline Phosphatase: 76 U/L (ref 38–126)
Anion gap: 6 (ref 5–15)
BUN: 35 mg/dL — ABNORMAL HIGH (ref 8–23)
CO2: 26 mmol/L (ref 22–32)
Calcium: 8.5 mg/dL — ABNORMAL LOW (ref 8.9–10.3)
Chloride: 111 mmol/L (ref 98–111)
Creatinine, Ser: 0.87 mg/dL (ref 0.44–1.00)
GFR, Estimated: >60 mL/min (ref >60)
Glucose, Bld: 112 mg/dL — ABNORMAL HIGH (ref 70–99)
Potassium: 3.1 mmol/L — ABNORMAL LOW (ref 3.5–5.1)
Sodium: 143 mmol/L (ref 135–145)
Total Bilirubin: 1.1 mg/dL (ref 0.3–1.2)
Total Protein: 6.6 g/dL (ref 6.5–8.1)

## 2022-07-17 LAB — MAGNESIUM: Magnesium: 2.1 mg/dL (ref 1.7–2.4)

## 2022-07-17 LAB — FOLATE: Folate: 23 ng/mL (ref >5.9)

## 2022-07-17 LAB — VITAMIN B12: Vitamin B-12: 188 pg/mL (ref 180–914)

## 2022-07-17 LAB — TSH: TSH: 3.185 u[IU]/mL (ref 0.350–4.500)

## 2022-07-17 MED ORDER — POTASSIUM CHLORIDE CRYS ER 20 MEQ PO TBCR: 40.0000 meq | EXTENDED_RELEASE_TABLET | Freq: Every day | ORAL | 0 refills | Status: AC

## 2022-07-17 MED ORDER — POTASSIUM CHLORIDE CRYS ER 20 MEQ PO TBCR
40.0000 meq | EXTENDED_RELEASE_TABLET | Freq: Once | ORAL | Status: AC
  Administered 2022-07-17: 40 meq via ORAL
  Filled 2022-07-17: qty 2

## 2022-07-17 MED ORDER — CELECOXIB 200 MG PO CAPS: 200.0000 mg | ORAL_CAPSULE | Freq: Two times a day (BID) | ORAL | Status: AC | PRN

## 2022-07-17 MED ORDER — BACLOFEN 10 MG PO TABS: 10.0000 mg | ORAL_TABLET | Freq: Three times a day (TID) | ORAL | 0 refills | Status: DC | PRN

## 2022-07-17 NOTE — TOC Transition Note (Signed)
Transition of Care [facility]) - CM/SW Discharge Note   Patient Details  Name: Shelly Mitchell MRN: [id number] Date of Birth: [birth year]  Transition of Care [facility]) CM/SW Contact:  Marlowe Sax, RN Phone Number: 07/17/2022, 12:22 PM   Clinical Narrative:      Transition of Care [facility]) Screening Note   Patient Details  Name: Shelly Mitchell Date of Birth: 08/28/1950   Transition of Care [facility]) CM/SW Contact:    Marlowe Sax, RN Phone Number: 07/17/2022, 12:22 PM    Transition of Care Department [facility]) has reviewed patient and no TOC needs have been identified at this time. We will continue to monitor patient advancement through interdisciplinary progression rounds. If new patient transition needs arise, please place a TOC consult.      Barriers to Discharge: Continued Medical Work up   Patient Goals and CMS Choice        Discharge Placement                       Discharge Plan and Services                                     Social Determinants of Health (SDOH) Interventions     Readmission Risk Interventions     No data to display

## 2022-07-17 NOTE — TOC Progression Note (Signed)
Transition of Care [facility]) - Progression Note    Patient Details  Name: Shelly Mitchell MRN: [id number] Date of Birth: 09-22-1950  Transition of Care [facility]) CM/SW Contact  Marlowe Sax, RN Phone Number: 07/17/2022, 10:22 AM  Clinical Narrative:    The patient is normally alert and oriented and usually independent with ADLs. Acute confusion TOC to follow for any DC needs and assist     Barriers to Discharge: Continued Medical Work up  Expected Discharge Plan and Services         Living arrangements for the past 2 months: Single Family Home                                       Social Determinants of Health (SDOH) Interventions    Readmission Risk Interventions     No data to display

## 2022-07-17 NOTE — Discharge Summary (Signed)
Physician Discharge Summary  ARTELIA GAME TDV:[id number] DOB: 07-14-1951 DOA: 07/16/2022  PCP: Marina Goodell, MD  Admit date: 07/16/2022 Discharge date: 07/17/2022  Admitted From: Home Disposition:  Home  Discharge Condition:Stable CODE STATUS:FULL Diet recommendation: Heart Healthy   Brief/Interim Summary: LASHAWNA Mitchell is a 71 y.o. female with medical history significant of hyperlipidemia, prior stroke, former smoker, hypertension, and more presents the ED with a chief complaint of altered mental status.  Patient has been complaining of sciatic nerve pain on the left side for 4 days .  Daughter found her covered in urine and feces, confused.  She is independent with ADLs and oriented at baseline.  Patient was admitted for the management of altered mental status.  Extensive investigation done.  CT head did not show any acute findings.  UDS negative.  Patient seen and examined at bedside this morning.  She is completely alert oriented.  She was sitting in the chair.  Feels comfortable.  Said the pain on the left hip is significantly much better today.  I ambulated her inside the room and she did very well.  She was able to go back to the chair on her own foot.  We discussed about the importance of taking the medications as instructed and continue pain medications as needed.  Her altered mental status most likely secondary to polypharmacy. Her potassium was found to be low, will continue potassium supplementation for next few days at home. She was mildly hypertensive, hopefully her blood pressure will be better after we restart her lisinopril I tried to call her daughter twice for discussion about discharge planning but calls  not received.  Patient is medically stable for discharge to home today.  I have recommended her to follow-up with her primary care physician in a week.  Discharge Diagnoses:  Principal Problem:   Acute metabolic encephalopathy Active Problems:    Leukocytosis   Hypokalemia   Essential hypertension   Hyperlipidemia   Anxiety   Acute urinary retention    Discharge Instructions  Discharge Instructions     Diet - low sodium heart healthy   Complete by: As directed    Discharge instructions   Complete by: As directed    1)Please follow up with your PCP in a week 2)Take your medications as instructed.   Increase activity slowly   Complete by: As directed       Allergies as of 07/17/2022   No Known Allergies      Medication List     STOP taking these medications    predniSONE 10 MG (21) Tbpk tablet Commonly known as: STERAPRED UNI-PAK 21 TAB       TAKE these medications    aspirin EC 81 MG tablet Take 1 tablet (81 mg total) by mouth daily.   baclofen 10 MG tablet Commonly known as: LIORESAL Take 1 tablet (10 mg total) by mouth 3 (three) times daily as needed for muscle spasms. What changed:  when to take this reasons to take this   celecoxib 200 MG capsule Commonly known as: CELEBREX Take 1 capsule (200 mg total) by mouth 2 (two) times daily as needed. What changed:  when to take this reasons to take this   FLUoxetine 10 MG tablet Commonly known as: PROZAC Take 10 mg by mouth daily.   hydrOXYzine 25 MG tablet Commonly known as: ATARAX Take 0.5 -1 tab PO TID PRN anxiety   lisinopril 5 MG tablet Commonly known as: ZESTRIL Take 10 mg by mouth.  rosuvastatin 20 MG tablet Commonly known as: CRESTOR Take by mouth.   traMADol 50 MG tablet Commonly known as: ULTRAM Take 1 tablet (50 mg total) by mouth every 6 (six) hours as needed.   traZODone 50 MG tablet Commonly known as: DESYREL Take 0.5 tablet PO at bed time x 3 days and then increase to 1 tab nightly What changed:  how much to take additional instructions        Follow-up Information     Feldpausch, Madaline Guthrie, MD. Schedule an appointment as soon as possible for a visit in 1 week(s).   Specialty: Family Medicine Contact  information: [address] [place] [address] [phone]                No Known Allergies  Consultations: None   Procedures/Studies: DG Chest Portable 1 View  Result Date: 07/16/2022 CLINICAL DATA:  Altered mental status. Pneumonia rule out. EXAM: PORTABLE CHEST 1 VIEW COMPARISON:  None Available. FINDINGS: Elevation of the right hemidiaphragm. Upper normal heart size. Streaky opacity at the left lung base typical of atelectasis. No pulmonary edema, pleural effusion or pneumothorax. No acute osseous findings. IMPRESSION: 1. Streaky left basilar opacity typical of atelectasis. 2. Elevation of the right hemidiaphragm. Electronically Signed   By: Narda Rutherford M.D.   On: 07/16/2022 19:39   CT HEAD WO CONTRAST ( )  Result Date: 07/16/2022 CLINICAL DATA:  Mental status change EXAM: CT HEAD WITHOUT CONTRAST TECHNIQUE: Contiguous axial images were obtained from the base of the skull through the vertex without intravenous contrast. RADIATION DOSE REDUCTION: This exam was performed according to the departmental dose-optimization program which includes automated exposure control, adjustment of the mA and/or kV according to patient size and/or use of iterative reconstruction technique. COMPARISON:  CT head 01/06/2022.  MRI brain 07/27/2018. FINDINGS: Brain: No evidence of acute infarction, hemorrhage, hydrocephalus, extra-axial collection or mass lesion/mass effect. Vascular: No hyperdense vessel or unexpected calcification. Skull: Normal. Negative for fracture or focal lesion. Sinuses/Orbits: No acute finding. Other: None. IMPRESSION: No acute intracranial abnormality. Electronically Signed   By: Darliss Cheney M.D.   On: 07/16/2022 19:29   MM 3D SCREEN BREAST BILATERAL  Result Date: 06/27/2022 CLINICAL DATA:  Screening. EXAM: DIGITAL SCREENING BILATERAL MAMMOGRAM WITH TOMOSYNTHESIS AND CAD TECHNIQUE: Bilateral screening digital craniocaudal and mediolateral oblique mammograms were  obtained. Bilateral screening digital breast tomosynthesis was performed. The images were evaluated with computer-aided detection. COMPARISON:  None available. ACR Breast Density Category b: There are scattered areas of fibroglandular density. FINDINGS: There are no findings suspicious for malignancy. IMPRESSION: No mammographic evidence of malignancy. A result letter of this screening mammogram will be mailed directly to the patient. RECOMMENDATION: Screening mammogram in one year. (Code:SM-B-01Y) BI-RADS CATEGORY  1: Negative. Electronically Signed   By: Norva Pavlov M.D.   On: 06/27/2022 13:39      Subjective: Patient seen and examined at bedside today.  Hemodynamically stable.  Alert and oriented comfortable to go home today  Discharge Exam: Vitals:   07/17/22 0739 07/17/22 0911  BP: (!) 155/79 (!) 155/79  Pulse: 79   Resp: 16   Temp: 98.6 F (37 C)   SpO2: 93%    Vitals:   07/16/22 2317 07/17/22 0110 07/17/22 0739 07/17/22 0911  BP: (!) 177/78 (!) 166/66 (!) 155/79 (!) 155/79  Pulse: 72 82 79   Resp: 16  16   Temp: 99 F (37.2 C)  98.6 F (37 C)   TempSrc:  SpO2: 95%  93%     General: Pt is alert, awake, not in acute distress Cardiovascular: RRR, S1/S2 +, no rubs, no gallops Respiratory: CTA bilaterally, no wheezing, no rhonchi Abdominal: Soft, NT, ND, bowel sounds + Extremities: no edema, no cyanosis    The results of significant diagnostics from this hospitalization (including imaging, microbiology, ancillary and laboratory) are listed below for reference.     Microbiology: Recent Results (from the past 240 hour(s))  SARS Coronavirus 2 by RT PCR (hospital order, performed in [facility] hospital lab) *cepheid single result test* Anterior Nasal Swab     Status: None   Collection Time: 07/16/22  7:37 PM   Specimen: Anterior Nasal Swab  Result Value Ref Range Status   SARS Coronavirus 2 by RT PCR NEGATIVE NEGATIVE Final    Comment: (NOTE) SARS-CoV-2 target  nucleic acids are NOT DETECTED.  The SARS-CoV-2 RNA is generally detectable in upper and lower respiratory specimens during the acute phase of infection. The lowest concentration of SARS-CoV-2 viral copies this assay can detect is 250 copies / mL. A negative result does not preclude SARS-CoV-2 infection and should not be used as the sole basis for treatment or other patient management decisions.  A negative result may occur with improper specimen collection / handling, submission of specimen other than nasopharyngeal swab, presence of viral mutation(s) within the areas targeted by this assay, and inadequate number of viral copies (<250 copies / mL). A negative result must be combined with clinical observations, patient history, and epidemiological information.  Fact Sheet for Patients:   RoadLapTop.co.zahttps://www.fda.gov/media/158405/download  Fact Sheet for Healthcare Providers: http://kim-miller.com/https://www.fda.gov/media/158404/download  This test is not yet approved or  cleared by the [place] FDA and has been authorized for detection and/or diagnosis of SARS-CoV-2 by FDA under an Emergency Use Authorization (EUA).  This EUA will remain in effect (meaning this test can be used) for the duration of the COVID-19 declaration under Section 564(b)(1) of the Act, 21 U.S.C. section 360bbb-3(b)(1), unless the authorization is terminated or revoked sooner.  Performed at [facility], [address] [place], [place] [address]      Labs: BNP (last 3 results) No results for input(s): "BNP" in the last 8760 hours. Basic Metabolic Panel: Recent Labs  Lab 07/16/22 1911 07/17/22 0701  NA 145 143  K 3.1* 3.1*  CL 111 111  CO2 24 26  GLUCOSE 125* 112*  BUN 29* 35*  CREATININE 0.83 0.87  CALCIUM 8.7* 8.5*  MG  --  2.1   Liver Function Tests: Recent Labs  Lab 07/16/22 1911 07/17/22 0701  AST 24 20  ALT 20 19  ALKPHOS 80 76  BILITOT 1.0 1.1  PROT 6.8 6.6  ALBUMIN 3.7 3.5   No results for  input(s): "LIPASE", "AMYLASE" in the last 168 hours. Recent Labs  Lab 07/16/22 2210  AMMONIA 12   CBC: Recent Labs  Lab 07/16/22 1911 07/17/22 0701  WBC 13.4* 10.8*  NEUTROABS 9.4* 6.1  HGB 13.7 13.0  HCT 42.0 40.2  MCV 91.9 93.1  PLT 310 294   Cardiac Enzymes: Recent Labs  Lab 07/16/22 1911  CKTOTAL 358*   BNP: Invalid input(s): "POCBNP" CBG: Recent Labs  Lab 07/16/22 1853  GLUCAP 104*   D-Dimer No results for input(s): "DDIMER" in the last 72 hours. Hgb A1c No results for input(s): "HGBA1C" in the last 72 hours. Lipid Profile No results for input(s): "CHOL", "HDL", "LDLCALC", "TRIG", "CHOLHDL", "LDLDIRECT" in the last 72 hours. Thyroid function studies  Recent Labs    07/17/22 0701  TSH 3.185   Anemia work up Recent Labs    07/17/22 0701  FOLATE 23.0   Urinalysis    Component Value Date/Time   COLORURINE YELLOW (A) 07/16/2022 1911   APPEARANCEUR CLEAR (A) 07/16/2022 1911   LABSPEC 1.020 07/16/2022 1911   PHURINE 5.0 07/16/2022 1911   GLUCOSEU 50 (A) 07/16/2022 1911   HGBUR SMALL (A) 07/16/2022 1911   BILIRUBINUR NEGATIVE 07/16/2022 1911   KETONESUR 5 (A) 07/16/2022 1911   PROTEINUR >=300 (A) 07/16/2022 1911   NITRITE NEGATIVE 07/16/2022 1911   LEUKOCYTESUR NEGATIVE 07/16/2022 1911   Sepsis Labs Recent Labs  Lab 07/16/22 1911 07/17/22 0701  WBC 13.4* 10.8*   Microbiology Recent Results (from the past 240 hour(s))  SARS Coronavirus 2 by RT PCR (hospital order, performed in [facility] hospital lab) *cepheid single result test* Anterior Nasal Swab     Status: None   Collection Time: 07/16/22  7:37 PM   Specimen: Anterior Nasal Swab  Result Value Ref Range Status   SARS Coronavirus 2 by RT PCR NEGATIVE NEGATIVE Final    Comment: (NOTE) SARS-CoV-2 target nucleic acids are NOT DETECTED.  The SARS-CoV-2 RNA is generally detectable in upper and lower respiratory specimens during the acute phase of infection. The lowest concentration of  SARS-CoV-2 viral copies this assay can detect is 250 copies / mL. A negative result does not preclude SARS-CoV-2 infection and should not be used as the sole basis for treatment or other patient management decisions.  A negative result may occur with improper specimen collection / handling, submission of specimen other than nasopharyngeal swab, presence of viral mutation(s) within the areas targeted by this assay, and inadequate number of viral copies (<250 copies / mL). A negative result must be combined with clinical observations, patient history, and epidemiological information.  Fact Sheet for Patients:   RoadLapTop.co.za  Fact Sheet for Healthcare Providers: http://kim-miller.com/  This test is not yet approved or  cleared by the [place] FDA and has been authorized for detection and/or diagnosis of SARS-CoV-2 by FDA under an Emergency Use Authorization (EUA).  This EUA will remain in effect (meaning this test can be used) for the duration of the COVID-19 declaration under Section 564(b)(1) of the Act, 21 U.S.C. section 360bbb-3(b)(1), unless the authorization is terminated or revoked sooner.  Performed at [facility], [address], [place], [place] [address]     Please note: You were cared for by a hospitalist during your hospital stay. Once you are discharged, your primary care physician will handle any further medical issues. Please note that NO REFILLS for any discharge medications will be authorized once you are discharged, as it is imperative that you return to your primary care physician (or establish a relationship with a primary care physician if you do not have one) for your post hospital discharge needs so that they can reassess your need for medications and monitor your lab values.    Time coordinating discharge: 40 minutes  SIGNED:   Burnadette Pop, MD  [facility] 07/17/2022, 12:05  PM Pager [phone]  If 7PM-7AM, please contact night-coverage [email or web address] Password [email or web address]

## 2022-07-18 LAB — URINE CULTURE: Culture: NO GROWTH

## 2022-07-21 LAB — VITAMIN B1: Vitamin B1 (Thiamine): 118 nmol/L (ref 66.5–200.0)

## 2022-07-22 LAB — VITAMIN B6: Vitamin B6: 3.4 ug/L (ref 3.4–65.2)

## 2022-07-24 DIAGNOSIS — R262 Difficulty in walking, not elsewhere classified: Secondary | ICD-10-CM | POA: Diagnosis not present

## 2022-07-24 DIAGNOSIS — M5442 Lumbago with sciatica, left side: Secondary | ICD-10-CM | POA: Diagnosis not present

## 2022-07-24 DIAGNOSIS — Z7689 Persons encountering health services in other specified circumstances: Secondary | ICD-10-CM | POA: Diagnosis not present

## 2022-08-05 DIAGNOSIS — M6281 Muscle weakness (generalized): Secondary | ICD-10-CM | POA: Diagnosis not present

## 2022-08-05 DIAGNOSIS — M5442 Lumbago with sciatica, left side: Secondary | ICD-10-CM | POA: Diagnosis not present

## 2022-08-08 ENCOUNTER — Other Ambulatory Visit: Payer: Self-pay | Admitting: Physical Medicine & Rehabilitation

## 2022-08-08 DIAGNOSIS — G8929 Other chronic pain: Secondary | ICD-10-CM | POA: Diagnosis not present

## 2022-08-08 DIAGNOSIS — M5442 Lumbago with sciatica, left side: Secondary | ICD-10-CM | POA: Diagnosis not present

## 2022-08-14 ENCOUNTER — Ambulatory Visit: Admission: RE | Admit: 2022-08-14 | Payer: No Typology Code available for payment source | Source: Ambulatory Visit

## 2022-08-21 ENCOUNTER — Ambulatory Visit
Admission: RE | Admit: 2022-08-21 | Discharge: 2022-08-21 | Disposition: A | Discharge status: Home / Self Care | Payer: No Typology Code available for payment source | Source: Ambulatory Visit | Attending: Physical Medicine & Rehabilitation | Admitting: Physical Medicine & Rehabilitation

## 2022-08-21 DIAGNOSIS — G8929 Other chronic pain: Secondary | ICD-10-CM | POA: Insufficient documentation

## 2022-08-21 DIAGNOSIS — M48061 Spinal stenosis, lumbar region without neurogenic claudication: Secondary | ICD-10-CM | POA: Diagnosis not present

## 2022-08-21 DIAGNOSIS — M5117 Intervertebral disc disorders with radiculopathy, lumbosacral region: Secondary | ICD-10-CM | POA: Diagnosis not present

## 2022-08-21 DIAGNOSIS — M4727 Other spondylosis with radiculopathy, lumbosacral region: Secondary | ICD-10-CM | POA: Diagnosis not present

## 2022-08-21 DIAGNOSIS — M5442 Lumbago with sciatica, left side: Secondary | ICD-10-CM | POA: Diagnosis not present

## 2022-08-29 DIAGNOSIS — M48062 Spinal stenosis, lumbar region with neurogenic claudication: Secondary | ICD-10-CM | POA: Diagnosis not present

## 2022-08-29 DIAGNOSIS — G8929 Other chronic pain: Secondary | ICD-10-CM | POA: Diagnosis not present

## 2022-08-29 DIAGNOSIS — M5442 Lumbago with sciatica, left side: Secondary | ICD-10-CM | POA: Diagnosis not present

## 2022-09-13 DIAGNOSIS — M48062 Spinal stenosis, lumbar region with neurogenic claudication: Secondary | ICD-10-CM | POA: Diagnosis not present

## 2022-09-13 DIAGNOSIS — M5442 Lumbago with sciatica, left side: Secondary | ICD-10-CM | POA: Diagnosis not present

## 2022-09-27 DIAGNOSIS — M48062 Spinal stenosis, lumbar region with neurogenic claudication: Secondary | ICD-10-CM | POA: Diagnosis not present

## 2022-09-27 DIAGNOSIS — G8929 Other chronic pain: Secondary | ICD-10-CM | POA: Diagnosis not present

## 2022-09-27 DIAGNOSIS — M5442 Lumbago with sciatica, left side: Secondary | ICD-10-CM | POA: Diagnosis not present
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
## 2022-12-16 DIAGNOSIS — F410 Panic disorder [episodic paroxysmal anxiety] without agoraphobia: Secondary | ICD-10-CM | POA: Diagnosis not present

## 2022-12-16 DIAGNOSIS — I10 Essential (primary) hypertension: Secondary | ICD-10-CM | POA: Diagnosis not present

## 2022-12-16 DIAGNOSIS — E785 Hyperlipidemia, unspecified: Secondary | ICD-10-CM | POA: Diagnosis not present

## 2022-12-16 DIAGNOSIS — F411 Generalized anxiety disorder: Secondary | ICD-10-CM | POA: Diagnosis not present

## 2022-12-16 DIAGNOSIS — M5432 Sciatica, left side: Secondary | ICD-10-CM | POA: Diagnosis not present

## 2022-12-16 DIAGNOSIS — R0789 Other chest pain: Secondary | ICD-10-CM | POA: Diagnosis not present

## 2022-12-16 DIAGNOSIS — G4700 Insomnia, unspecified: Secondary | ICD-10-CM | POA: Diagnosis not present

## 2022-12-16 DIAGNOSIS — Z8673 Personal history of transient ischemic attack (TIA), and cerebral infarction without residual deficits: Secondary | ICD-10-CM | POA: Diagnosis not present

## 2023-01-29 ENCOUNTER — Ambulatory Visit
Admission: EM | Admit: 2023-01-29 | Discharge: 2023-01-29 | Disposition: A | Discharge status: Home / Self Care | Payer: Medicare HMO | Attending: Emergency Medicine | Admitting: Emergency Medicine

## 2023-01-29 ENCOUNTER — Encounter: Payer: Self-pay | Admitting: Emergency Medicine

## 2023-01-29 DIAGNOSIS — T783XXA Angioneurotic edema, initial encounter: Secondary | ICD-10-CM

## 2023-01-29 MED ORDER — PREDNISONE 10 MG (21) PO TBPK: ORAL_TABLET | ORAL | 0 refills | Status: DC

## 2023-01-29 MED ORDER — DEXAMETHASONE SODIUM PHOSPHATE 10 MG/ML IJ SOLN
10.0000 mg | Freq: Once | INTRAMUSCULAR | Status: AC
  Administered 2023-01-29: 10 mg via INTRAMUSCULAR

## 2023-01-29 NOTE — ED Provider Notes (Signed)
[facility]    CSN: [id number] Arrival date & time: 01/29/23  1001      History   Chief Complaint Chief Complaint  Patient presents with   Oral Swelling    HPI Shelly Mitchell is a 72 y.o. female.   HPI  72 year old female with a past medical history significant for essential hypertension, acute metabolic encephalopathy, CVA, and hyperlipidemia presents for evaluation of swelling to her upper lip that started this morning after she drank a cup of coffee.  She states that is the same coffee that she has been drinking for years and has not had any new medications or foods added.  She denies any swelling of her tongue or tightness in her throat.  Patient is on 10 mg of lisinopril daily for her hypertension.  She denies any itching or pain to her lip but she does say that it is numb.  Past Medical History:  Diagnosis Date   Hypercholesterolemia    Stroke (HCC) 2019   No deficits   Tobacco abuse    Wears dentures    full upper    Patient Active Problem List   Diagnosis Date Noted   Acute metabolic encephalopathy 07/16/2022   Leukocytosis 07/16/2022   Hypokalemia 07/16/2022   Essential hypertension 07/16/2022   Hyperlipidemia 07/16/2022   Anxiety 07/16/2022   Acute urinary retention 07/16/2022   CVA (cerebral vascular accident) (HCC) 07/27/2018    Past Surgical History:  Procedure Laterality Date   CATARACT EXTRACTION W/PHACO Left 01/28/2022   Procedure: CATARACT EXTRACTION PHACO AND INTRAOCULAR LENS PLACEMENT (IOC) LEFT 3.27 00:33.1;  Surgeon: Nevada Crane, MD;  Location: [facility];  Service: Ophthalmology;  Laterality: Left;   CATARACT EXTRACTION W/PHACO Right 02/11/2022   Procedure: CATARACT EXTRACTION PHACO AND INTRAOCULAR LENS PLACEMENT (IOC) RIGHT 3.97 00:37.5;  Surgeon: Nevada Crane, MD;  Location: [facility];  Service: Ophthalmology;  Laterality: Right;   CESAREAN SECTION     CHOLECYSTECTOMY      OB History   No  obstetric history on file.      Home Medications    Prior to Admission medications   Medication Sig Start Date End Date Taking? Authorizing Provider  FLUoxetine (PROZAC) 10 MG tablet Take 10 mg by mouth daily.   Yes [provider]  lisinopril (ZESTRIL) 5 MG tablet Take 10 mg by mouth. 09/12/20  Yes [provider]  predniSONE (STERAPRED UNI-PAK 21 TAB) 10 MG (21) TBPK tablet Take 6 tablets on day 1, 5 tablets day 2, 4 tablets day 3, 3 tablets day 4, 2 tablets day 5, 1 tablet day 6 01/29/23  Yes Becky Augusta, NP  rosuvastatin (CRESTOR) 20 MG tablet Take by mouth. 07/04/20  Yes [provider]  traZODone (DESYREL) 50 MG tablet Take 0.5 tablet PO at bed time x 3 days and then increase to 1 tab nightly Patient taking differently: 50 mg. 05/02/21 01/29/23 Yes Shirlee Latch, PA-C  aspirin EC 81 MG tablet Take 1 tablet (81 mg total) by mouth daily. 07/28/18   Milagros Loll, MD  baclofen (LIORESAL) 10 MG tablet Take 1 tablet (10 mg total) by mouth 3 (three) times daily as needed for muscle spasms. 07/17/22   Burnadette Pop, MD  celecoxib (CELEBREX) 200 MG capsule Take 1 capsule (200 mg total) by mouth 2 (two) times daily as needed. 07/17/22   Burnadette Pop, MD  hydrOXYzine (ATARAX/VISTARIL) 25 MG tablet Take 0.5 -1 tab PO TID PRN anxiety 05/02/21   Michiel Cowboy,  Algis Greenhouse, PA-C  potassium chloride SA (KLOR-CON M) 20 MEQ tablet Take 2 tablets (40 mEq total) by mouth daily for 3 days. 07/18/22 07/21/22  Burnadette Pop, MD  traMADol (ULTRAM) 50 MG tablet Take 1 tablet (50 mg total) by mouth every 6 (six) hours as needed. 06/30/22   Becky Augusta, NP  atorvastatin (LIPITOR) 40 MG tablet Take 1 tablet (40 mg total) by mouth daily at 6 PM. 07/28/18 02/08/20  Milagros Loll, MD    Family History Family History  Problem Relation Age of Onset   CVA Mother     Social History Social History   Tobacco Use   Smoking status: Former    Types: Cigarettes    Quit date: 07/27/2018    Years  since quitting: 4.5   Smokeless tobacco: Never  Vaping Use   Vaping Use: Never used  Substance Use Topics   Alcohol use: No   Drug use: No     Allergies   Patient has no known allergies.   Review of Systems Review of Systems  HENT:  Positive for facial swelling. Negative for trouble swallowing.   Respiratory:  Negative for shortness of breath and stridor.   Skin:  Negative for rash.     Physical Exam Triage Vital Signs ED Triage Vitals  Enc Vitals Group     BP 01/29/23 1128 (!) 140/70     Pulse Rate 01/29/23 1128 60     Resp 01/29/23 1128 16     Temp 01/29/23 1128 98 F (36.7 C)     Temp src --      SpO2 01/29/23 1128 99 %     Weight --      Height --      Head Circumference --      Peak Flow --      Pain Score 01/29/23 1126 0     Pain Loc --      Pain Edu? --      Excl. in GC? --    No data found.  Updated Vital Signs BP (!) 140/70 (BP Location: Left Arm)   Pulse 60   Temp 98 F (36.7 C)   Resp 16   SpO2 99%   Visual Acuity Right Eye Distance:   Left Eye Distance:   Bilateral Distance:    Right Eye Near:   Left Eye Near:    Bilateral Near:     Physical Exam Vitals and nursing note reviewed.  Constitutional:      Appearance: Normal appearance.  HENT:     Head: Normocephalic and atraumatic.     Mouth/Throat:     Mouth: Mucous membranes are moist.     Pharynx: Oropharynx is clear. No oropharyngeal exudate or posterior oropharyngeal erythema.     Comments: Patient has edema to her upper lip but there is no intraoral lesions noted.  Patient's airway is patent. Skin:    General: Skin is warm and dry.     Capillary Refill: Capillary refill takes less than 2 seconds.     Findings: No erythema or rash.  Neurological:     General: No focal deficit present.     Mental Status: She is alert and oriented to person, place, and time.      UC Treatments / Results  Labs (all labs ordered are listed, but only abnormal results are displayed) Labs  Reviewed - No data to display  EKG   Radiology No results found.  Procedures Procedures (including critical care time)  Medications Ordered in UC Medications - No data to display  Initial Impression / Assessment and Plan / UC Course  I have reviewed the triage vital signs and the nursing notes.  Pertinent labs & imaging results that were available during my care of the patient were reviewed by me and considered in my medical decision making (see chart for details).   Patient is a pleasant, nontoxic-appearing 72 year old female presenting for evaluation of isolated upper lip swelling that started this morning.  As you can see in the image above, the swelling is isolated to her upper lip and there is no circumoral erythema, rashes, or lesions.  Also no intraoral lesions.  Her airway is patent and she is able to speak in full sentence without dyspnea or tachypnea.  She is on lisinopril for high blood pressure at a low dose of 10 mg.  I suspect that this is angioedema has resolved with lisinopril and I have instructed her to stop lisinopril and follow-up with Dr. Maryjane Hurter at [facility].  I will have staff administer 10 mg of IM Decadron here in clinic and discharged home on a prednisone pack with strict ER precautions.  Final Clinical Impressions(s) / UC Diagnoses   Final diagnoses:  Angioedema, initial encounter     Discharge Instructions      I suspect that your upper lip swelling is a result of your medication lisinopril.  Please stop taking this medication and call your primary care doctor, Dr. Maryjane Hurter, to make an appointment to discuss alternative therapies.  We have given you an injection of steroids in the clinic to help with the swelling.  I am also to prescribe you oral prednisone that you are to start tomorrow morning.  I also want you to take an over-the-counter antihistamine such as Claritin or Zyrtec during the day and you may take 2 over-the-counter Benadryl  at bedtime to help with swelling and any itching.  I also want you to take over-the-counter Pepcid 20 mg twice daily.  Take the antihistamine and the Pepcid for the same length of time that you take the prednisone.  If you develop further swelling of your lips, swelling of your tongue, or develop tightness in your throat you need to call 911 and go to the ER.  Do not drive yourself.       ED Prescriptions     Medication Sig Dispense Auth. Provider   predniSONE (STERAPRED UNI-PAK 21 TAB) 10 MG (21) TBPK tablet Take 6 tablets on day 1, 5 tablets day 2, 4 tablets day 3, 3 tablets day 4, 2 tablets day 5, 1 tablet day 6 21 tablet Becky Augusta, NP      PDMP not reviewed this encounter.   Becky Augusta, NP 01/29/23 1147

## 2023-01-29 NOTE — ED Triage Notes (Signed)
Pt noticed her top lip was swollen today after drinking a cup of coffee.

## 2023-01-29 NOTE — Discharge Instructions (Addendum)
I suspect that your upper lip swelling is a result of your medication lisinopril.  Please stop taking this medication and call your primary care doctor, Dr. Maryjane Hurter, to make an appointment to discuss alternative therapies.  We have given you an injection of steroids in the clinic to help with the swelling.  I am also to prescribe you oral prednisone that you are to start tomorrow morning.  I also want you to take an over-the-counter antihistamine such as Claritin or Zyrtec during the day and you may take 2 over-the-counter Benadryl at bedtime to help with swelling and any itching.  I also want you to take over-the-counter Pepcid 20 mg twice daily.  Take the antihistamine and the Pepcid for the same length of time that you take the prednisone.  If you develop further swelling of your lips, swelling of your tongue, or develop tightness in your throat you need to call 911 and go to the ER.  Do not drive yourself.

## 2023-09-22 ENCOUNTER — Ambulatory Visit: Payer: No Typology Code available for payment source | Admitting: Dermatology

## 2023-12-23 DIAGNOSIS — R7301 Impaired fasting glucose: Secondary | ICD-10-CM | POA: Diagnosis not present

## 2023-12-23 DIAGNOSIS — F411 Generalized anxiety disorder: Secondary | ICD-10-CM | POA: Diagnosis not present

## 2023-12-23 DIAGNOSIS — E785 Hyperlipidemia, unspecified: Secondary | ICD-10-CM | POA: Diagnosis not present

## 2023-12-23 DIAGNOSIS — Z8673 Personal history of transient ischemic attack (TIA), and cerebral infarction without residual deficits: Secondary | ICD-10-CM | POA: Diagnosis not present

## 2023-12-23 DIAGNOSIS — I10 Essential (primary) hypertension: Secondary | ICD-10-CM | POA: Diagnosis not present

## 2023-12-23 DIAGNOSIS — F410 Panic disorder [episodic paroxysmal anxiety] without agoraphobia: Secondary | ICD-10-CM | POA: Diagnosis not present

## 2023-12-23 DIAGNOSIS — G4700 Insomnia, unspecified: Secondary | ICD-10-CM | POA: Diagnosis not present

## 2023-12-26 DIAGNOSIS — E785 Hyperlipidemia, unspecified: Secondary | ICD-10-CM | POA: Diagnosis not present

## 2023-12-26 DIAGNOSIS — R7301 Impaired fasting glucose: Secondary | ICD-10-CM | POA: Diagnosis not present

## 2024-03-18 DIAGNOSIS — E785 Hyperlipidemia, unspecified: Secondary | ICD-10-CM | POA: Diagnosis not present

## 2024-03-18 DIAGNOSIS — F411 Generalized anxiety disorder: Secondary | ICD-10-CM | POA: Diagnosis not present

## 2024-03-18 DIAGNOSIS — F410 Panic disorder [episodic paroxysmal anxiety] without agoraphobia: Secondary | ICD-10-CM | POA: Diagnosis not present

## 2024-04-16 DIAGNOSIS — H524 Presbyopia: Secondary | ICD-10-CM | POA: Diagnosis not present

## 2024-04-29 DIAGNOSIS — G4700 Insomnia, unspecified: Secondary | ICD-10-CM | POA: Diagnosis not present

## 2024-04-29 DIAGNOSIS — F410 Panic disorder [episodic paroxysmal anxiety] without agoraphobia: Secondary | ICD-10-CM | POA: Diagnosis not present

## 2024-04-29 DIAGNOSIS — F411 Generalized anxiety disorder: Secondary | ICD-10-CM | POA: Diagnosis not present

## 2024-04-29 DIAGNOSIS — Z1331 Encounter for screening for depression: Secondary | ICD-10-CM | POA: Diagnosis not present

## 2024-09-09 ENCOUNTER — Ambulatory Visit: Payer: Self-pay | Admitting: Family Medicine

## 2024-09-09 ENCOUNTER — Ambulatory Visit
Admission: EM | Admit: 2024-09-09 | Discharge: 2024-09-09 | Disposition: A | Discharge status: Home / Self Care | Attending: Family Medicine | Admitting: Family Medicine

## 2024-09-09 ENCOUNTER — Ambulatory Visit

## 2024-09-09 DIAGNOSIS — M5417 Radiculopathy, lumbosacral region: Secondary | ICD-10-CM

## 2024-09-09 MED ORDER — TRAMADOL HCL 50 MG PO TABS: 50.0000 mg | ORAL_TABLET | Freq: Two times a day (BID) | ORAL | 0 refills | Status: AC | PRN

## 2024-09-09 MED ORDER — PREDNISONE 10 MG (21) PO TBPK: ORAL_TABLET | ORAL | 0 refills | Status: DC

## 2024-09-09 MED ORDER — IBUPROFEN 400 MG PO TABS
400.0000 mg | ORAL_TABLET | Freq: Once | ORAL | Status: AC
  Administered 2024-09-09: 400 mg via ORAL

## 2024-09-09 MED ORDER — METHOCARBAMOL 500 MG PO TABS: 500.0000 mg | ORAL_TABLET | Freq: Every evening | ORAL | 0 refills | Status: AC | PRN

## 2024-09-09 NOTE — Discharge Instructions (Addendum)
 Your back x-ray shows some age-related changes and some arthritis but no significant broken bones or dislocated bones. The radiologist has not yet read your xray. If it is significantly abnormal or urgent, someone will contact you.  You should see your results in MyChart.   If medication was prescribed, stop by the pharmacy to pick up your prescriptions.  For your back pain, Take 1500 mg Tylenol  twice a day, take muscle relaxer at bedtime,  as needed for pain.  Active prednisone  as prescribed.  I also gave you a few tramadol  for increased pain.  Consider stopping by the pharmacy or dollar store to pick up some Lidocaine  patches. Apply for 12 hours and then remove.   Watch for worsening symptoms such as an increasing weakness or loss of sensation in your arms or legs, increasing pain and/or the loss of bladder or bowel function. Should any of these occur, go to the emergency department immediately.

## 2024-09-09 NOTE — ED Triage Notes (Signed)
 Sx x 1 week  Left side back pain that radiates down her leg to her foot.

## 2024-09-09 NOTE — ED Provider Notes (Signed)
 " [facility]    CSN: [id number] Arrival date & time: 09/09/24  1218      History   Chief Complaint Chief Complaint  Patient presents with   Back Pain    HPI  HPI Shelly Mitchell is a 74 y.o. female.   Shelly Mitchell presents for left low back pain that started a week ago after stepping on a rock. Pain is described as throbbing under her feet and sharp pain in her back and does radiate down her left leg. Pain rated 8/10.  Endorses saddle paraesthesia, bowel or bladder incontinence.  Tried some nerve medicine for her legs and feet.  Continues to have pain with movement. Shelly Mitchell does feel like her legs are weak.   Has history of low back pain and this pain is similar.  Took some ibuprofen  without relief but none today.     Past Medical History:  Diagnosis Date   Hypercholesterolemia    Stroke (HCC) 2019   No deficits   Tobacco abuse    Wears dentures    full upper    Patient Active Problem List   Diagnosis Date Noted   Acute metabolic encephalopathy 07/16/2022   Leukocytosis 07/16/2022   Hypokalemia 07/16/2022   Essential hypertension 07/16/2022   Hyperlipidemia 07/16/2022   Anxiety 07/16/2022   Acute urinary retention 07/16/2022   CVA (cerebral vascular accident) (HCC) 07/27/2018    Past Surgical History:  Procedure Laterality Date   CATARACT EXTRACTION W/PHACO Left 01/28/2022   Procedure: CATARACT EXTRACTION PHACO AND INTRAOCULAR LENS PLACEMENT (IOC) LEFT 3.27 00:33.1;  Surgeon: Myrna Adine Anes, MD;  Location: [facility];  Service: Ophthalmology;  Laterality: Left;   CATARACT EXTRACTION W/PHACO Right 02/11/2022   Procedure: CATARACT EXTRACTION PHACO AND INTRAOCULAR LENS PLACEMENT (IOC) RIGHT 3.97 00:37.5;  Surgeon: Myrna Adine Anes, MD;  Location: [facility];  Service: Ophthalmology;  Laterality: Right;   CESAREAN SECTION     CHOLECYSTECTOMY      OB History   No obstetric history on file.      Home Medications    Prior to  Admission medications  Medication Sig Start Date End Date Taking? Authorizing Provider  aspirin  EC 81 MG tablet Take 1 tablet (81 mg total) by mouth daily. 07/28/18  Yes Sudini, Philis, MD  celecoxib  (CELEBREX ) 200 MG capsule Take 1 capsule (200 mg total) by mouth 2 (two) times daily as needed. 07/17/22  Yes Adhikari, Amrit, MD  FLUoxetine (PROZAC) 10 MG tablet Take 10 mg by mouth daily.   Yes [provider]  hydrochlorothiazide (HYDRODIURIL) 12.5 MG tablet Take 12.5 mg by mouth daily. 06/11/24  Yes [provider]  hydrOXYzine  (ATARAX /VISTARIL ) 25 MG tablet Take 0.5 -1 tab PO TID PRN anxiety 05/02/21  Yes Arvis Huxley B, PA-C  methocarbamol  (ROBAXIN ) 500 MG tablet Take 1 tablet (500 mg total) by mouth at bedtime as needed for muscle spasms. 09/09/24  Yes Azaela Caracci, DO  rosuvastatin  (CRESTOR ) 20 MG tablet Take by mouth. 07/04/20  Yes [provider]  lisinopril  (ZESTRIL ) 5 MG tablet Take 10 mg by mouth. 09/12/20   [provider]  potassium chloride  SA (KLOR-CON  M) 20 MEQ tablet Take 2 tablets (40 mEq total) by mouth daily for 3 days. 07/18/22 07/21/22  Jillian Buttery, MD  predniSONE  (STERAPRED UNI-PAK 21 TAB) 10 MG (21) TBPK tablet Take 6 tablets on day 1, 5 tablets day 2, 4 tablets day 3, 3 tablets day 4, 2 tablets day 5, 1 tablet day 6 09/09/24  Shawnia Vizcarrondo, DO  traMADol  (ULTRAM ) 50 MG tablet Take 1 tablet (50 mg total) by mouth every 12 (twelve) hours as needed. 09/09/24   Jawanna Dykman, DO  traZODone  (DESYREL ) 50 MG tablet Take 0.5 tablet PO at bed time x 3 days and then increase to 1 tab nightly Patient taking differently: 50 mg. 05/02/21 01/29/23  Arvis Jolan NOVAK, PA-C  atorvastatin  (LIPITOR) 40 MG tablet Take 1 tablet (40 mg total) by mouth daily at 6 PM. 07/28/18 02/08/20  Yisroel Sleight, MD    Family History Family History  Problem Relation Age of Onset   CVA Mother     Social History Social History[1]   Allergies   Lisinopril    Review  of Systems Review of Systems: egative unless otherwise stated in HPI.      Physical Exam Triage Vital Signs ED Triage Vitals  Encounter Vitals Group     BP 09/09/24 1249 (!) 145/83     Girls Systolic BP Percentile --      Girls Diastolic BP Percentile --      Boys Systolic BP Percentile --      Boys Diastolic BP Percentile --      Pulse Rate 09/09/24 1249 77     Resp 09/09/24 1249 16     Temp 09/09/24 1249 98.5 F (36.9 C)     Temp Source 09/09/24 1249 Oral     SpO2 09/09/24 1249 95 %     Weight 09/09/24 1248 185 lb (83.9 kg)     Height --      Head Circumference --      Peak Flow --      Pain Score 09/09/24 1248 8     Pain Loc --      Pain Education --      Exclude from Growth Chart --    No data found.  Updated Vital Signs BP (!) 145/83 (BP Location: Right Arm)   Pulse 77   Temp 98.5 F (36.9 C) (Oral)   Resp 16   Wt 83.9 kg   SpO2 95%   BMI 33.84 kg/m   Visual Acuity Right Eye Distance:   Left Eye Distance:   Bilateral Distance:    Right Eye Near:   Left Eye Near:    Bilateral Near:     Physical Exam GEN: well appearing female in no acute distress  CVS: well perfused  RESP: speaking in full sentences without pause, no respiratory distress  MSK:  Thoracic and Lumbar Spine: - Inspection: no gross deformity or asymmetry, swelling or ecchymosis. No skin changes  - Palpation: TTP over the lumbar spinous processes, left lumbar paraspinal muscle tenderness and hypertonicity, no SI joint tenderness bilaterally - ROM: limited active ROM of the lumbar and thoracic spine in flexion extension and rotation   - Strength: 5/5 strength of lower extremity in L4-S1 nerve root distributions b/l - Neuro: sensation intact in the L4-S1 nerve root distribution b/l, 2+ L4 and S1 reflexes - Special testing: positive left straight leg raise SKIN: warm, dry, no overly skin rash or erythema    UC Treatments / Results  Labs (all labs ordered are listed, but only abnormal  results are displayed) Labs Reviewed - No data to display  EKG   Radiology DG Lumbar Spine Complete Result Date: 09/09/2024 CLINICAL DATA:  Midline to left-sided low back pain 1 week. Left lower extremity radiculopathy. EXAM: LUMBAR SPINE - COMPLETE 4+ VIEW COMPARISON:  MRI 08/21/2022 FINDINGS: Hypoplastic twelfth ribs with 4 non  rib-bearing lumbar vertebrae. Vertebral body alignment and heights are normal. There is moderate spondylosis of the lumbar spine to include facet arthropathy. No evidence of acute compression fracture. No spondylolisthesis or spondylolysis. Mild disc space narrowing at the L4-S1 level. Degenerative change of the hips. IMPRESSION: 1. No acute findings. 2. 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 Electronically Signed   By: Toribio Agreste M.D.   On: 09/09/2024 13:43     Procedures Procedures (including critical care time)  Medications Ordered in UC Medications  ibuprofen  (ADVIL ) tablet 400 mg (400 mg Oral Given 09/09/24 1307)    Initial Impression / Assessment and Plan / UC Course  I have reviewed the triage vital signs and the nursing notes.  Pertinent labs & imaging results that were available during my care of the patient were reviewed by me and considered in my medical decision making (see chart for details).      Pt is a 74 y.o.  female with acute on chronic lower back pain that radiates down her left leg for the past week.  Given 3 Profen for 100 mg here for pain.  Vital signs stable.  She is afebrile and satting adequately on room air.  On chart review, she had an MRI of her lumbar spine in December 2023 that showed a mall broad disc bulge at L4-S1 with moderately severe spinal stenosis and foraminal stenosis on the left.   Obtained lumbar plain films today that showed no acute fracture.  Radiologist impression reviewed and notes moderate spondylosis of the lumbar spine with mild disc disease.  At L4-S1  level.  Suspect lumbar radiculitis secondary to history of bulging disc spinal stenosis.  Patient to gradually return to normal activities, as tolerated and continue ordinary activities within the limits permitted by pain. Prescribed tramadol  for increased pain, prednisone  taper and muscle relaxer  for pain relief.  Advised patient to avoid other NSAIDs while taking prednisone .  Tylenol  and Lidocaine  patches PRN for multimodal pain relief. Counseled patient on red flag symptoms and when to seek immediate care.  No red flags suggesting cauda equina syndrome or progressive major motor weakness. Patient to follow up with orthopedic provider if symptoms do not improve with conservative treatment.  Return and ED precautions given.    Discussed MDM, treatment plan and plan for follow-up with patient who agrees with plan.   Final Clinical Impressions(s) / UC Diagnoses   Final diagnoses:  Lumbosacral radiculitis     Discharge Instructions      Your back x-ray shows some age-related changes and some arthritis but no significant broken bones or dislocated bones. The radiologist has not yet read your xray. If it is significantly abnormal or urgent, someone will contact you.  You should see your results in MyChart.   If medication was prescribed, stop by the pharmacy to pick up your prescriptions.  For your back pain, Take 1500 mg Tylenol  twice a day, take muscle relaxer at bedtime,  as needed for pain.  Active prednisone  as prescribed.  I also gave you a few tramadol  for increased pain.  Consider stopping by the pharmacy or dollar store to pick up some Lidocaine  patches. Apply for 12 hours and then remove.   Watch for worsening symptoms such as an increasing weakness or loss of sensation in your arms or legs, increasing pain and/or the loss of bladder or bowel function. Should any of these occur, go to the emergency department immediately.  ED Prescriptions     Medication Sig Dispense  Auth. Provider   predniSONE  (STERAPRED UNI-PAK 21 TAB) 10 MG (21) TBPK tablet Take 6 tablets on day 1, 5 tablets day 2, 4 tablets day 3, 3 tablets day 4, 2 tablets day 5, 1 tablet day 6 21 tablet Almer Bushey, DO   traMADol  (ULTRAM ) 50 MG tablet Take 1 tablet (50 mg total) by mouth every 12 (twelve) hours as needed. 12 tablet Talayah Picardi, DO   methocarbamol  (ROBAXIN ) 500 MG tablet Take 1 tablet (500 mg total) by mouth at bedtime as needed for muscle spasms. 20 tablet Wanita Derenzo, DO      I have reviewed the PDMP during this encounter.      [1]  Social History Tobacco Use   Smoking status: Former    Current packs/day: 0.00    Types: Cigarettes    Quit date: 07/27/2018    Years since quitting: 6.1   Smokeless tobacco: Never  Vaping Use   Vaping status: Never Used  Substance Use Topics   Alcohol use: No   Drug use: No     Kriste Berth, DO 09/09/24 1754  "

## 2024-09-17 ENCOUNTER — Ambulatory Visit
Admission: RE | Admit: 2024-09-17 | Discharge: 2024-09-17 | Disposition: A | Discharge status: Home / Self Care | Attending: Family Medicine | Admitting: Family Medicine

## 2024-09-17 VITALS — BP 138/78 | HR 68 | Temp 98.8°F | Resp 15 | Ht 62.0 in | Wt 185.0 lb

## 2024-09-17 DIAGNOSIS — M5417 Radiculopathy, lumbosacral region: Secondary | ICD-10-CM

## 2024-09-17 MED ORDER — PREDNISONE 10 MG (21) PO TBPK: ORAL_TABLET | Freq: Every day | ORAL | 0 refills | Status: AC

## 2024-09-17 MED ORDER — KETOROLAC TROMETHAMINE 60 MG/2ML IM SOLN
15.0000 mg | Freq: Once | INTRAMUSCULAR | Status: AC
  Administered 2024-09-17: 15 mg via INTRAMUSCULAR

## 2024-09-17 MED ORDER — KETOROLAC TROMETHAMINE 60 MG/2ML IM SOLN: 30.0000 mg | Freq: Once | INTRAMUSCULAR | Status: DC
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
## 2024-09-17 NOTE — Discharge Instructions (Addendum)
 See handout on lower back rehab exercises.  You may need to start physical therapy. Take the prednisone  for inflammation of muscles and nerves of your spine. Take 500 mg of Tylenol  with 200 mg ibuprofen  for pain every 6 hours. Continue muscle relaxer at bedtime.   You have a condition requiring you to follow up with Orthopedics so please call one of the following office for appointment:   [facility] [address] [place], [place] [address] Phone: [phone]  [facility] [address] [place], [place] [address] Phone: [phone]

## 2024-09-17 NOTE — ED Triage Notes (Signed)
 Patient states that she was seen on 09/09/24 here for back pain.  Patient states that her back pain has improved but still has it.
